# Patient Record
Sex: Male | Born: 1955 | Marital: Married | Smoking: Never smoker
Health system: Southern US, Community
[De-identification: ages and names within clinical notes are randomized; demographics above are authoritative.]

## PROBLEM LIST (undated history)

## (undated) DIAGNOSIS — J189 Pneumonia, unspecified organism: Secondary | ICD-10-CM

## (undated) DIAGNOSIS — R569 Unspecified convulsions: Secondary | ICD-10-CM

## (undated) DIAGNOSIS — F419 Anxiety disorder, unspecified: Secondary | ICD-10-CM

## (undated) DIAGNOSIS — R51 Headache: Secondary | ICD-10-CM

## (undated) DIAGNOSIS — T8859XA Other complications of anesthesia, initial encounter: Secondary | ICD-10-CM

## (undated) DIAGNOSIS — K219 Gastro-esophageal reflux disease without esophagitis: Secondary | ICD-10-CM

## (undated) DIAGNOSIS — R519 Headache, unspecified: Secondary | ICD-10-CM

## (undated) DIAGNOSIS — R7303 Prediabetes: Secondary | ICD-10-CM

## (undated) DIAGNOSIS — I10 Essential (primary) hypertension: Secondary | ICD-10-CM

## (undated) DIAGNOSIS — Z9981 Dependence on supplemental oxygen: Secondary | ICD-10-CM

## (undated) DIAGNOSIS — N189 Chronic kidney disease, unspecified: Secondary | ICD-10-CM

## (undated) DIAGNOSIS — R590 Localized enlarged lymph nodes: Secondary | ICD-10-CM

## (undated) DIAGNOSIS — T4145XA Adverse effect of unspecified anesthetic, initial encounter: Secondary | ICD-10-CM

## (undated) DIAGNOSIS — F32A Depression, unspecified: Secondary | ICD-10-CM

## (undated) DIAGNOSIS — M1990 Unspecified osteoarthritis, unspecified site: Secondary | ICD-10-CM

## (undated) DIAGNOSIS — F329 Major depressive disorder, single episode, unspecified: Secondary | ICD-10-CM

## (undated) HISTORY — PX: APPENDECTOMY: SHX54

## (undated) HISTORY — PX: CHOLECYSTECTOMY: SHX55

## (undated) HISTORY — PX: CARDIAC CATHETERIZATION: SHX172

---

## 2007-07-02 ENCOUNTER — Encounter
Admission: RE | Admit: 2007-07-02 | Discharge: 2007-09-30 | Payer: Self-pay | Admitting: Physical Medicine & Rehabilitation

## 2007-07-07 ENCOUNTER — Ambulatory Visit: Payer: Self-pay | Admitting: Physical Medicine & Rehabilitation

## 2007-09-01 ENCOUNTER — Ambulatory Visit: Payer: Self-pay | Admitting: Physical Medicine & Rehabilitation

## 2007-11-21 ENCOUNTER — Encounter
Admission: RE | Admit: 2007-11-21 | Discharge: 2007-11-21 | Payer: Self-pay | Admitting: Physical Medicine & Rehabilitation

## 2010-07-04 NOTE — Group Therapy Note (Signed)
REFERRING PHYSICIAN:  Dr. Roselle Locus.   PURPOSE OF EVALUATION:  Evaluating chronic low back pain.   HISTORY OF PRESENT ILLNESS:  Corey Griffith is a 55 year old gentleman  referred to this office by Dr. Roselle Locus, his primary care physician  for evaluation and treatment of chronic low back pain.  Minimal medical  records accompany the patient and those were reviewed prior to the  office visit and then with the patient in the office today.   It appears that the patient has had a history of lumbar and thoracic  compression fractures from T8 through T10 after a fall onto a pavement  in 1993. He also suffered a basilar skull fracture at that same time.  He reports persistent back pain since that time.   February 25, 2007, a lumbar spine film showed no acute injury with  degenerative changes especially over the facet joints inferiorly.   February 24, 2007, the patient saw Dr. Roselle Locus and was given Darvocet  for pain management. He previously had been treated by Dr. Craig Guess and  that treatment was discontinued secondary to Dr. Craig Guess not accepting  the patient's insurance. He apparently has been under worker's comp  through Dr. Craig Guess since 1993. He had started Hydrocodone appropriate  three weeks ago in March and that was prescribed by Dr. Craig Guess one  time.  Subsequently, he has received only Darvocet from Dr. Roselle Locus.   The patient reports pain in his mid back down to his waist with  complaints of stabbing, burning and constant aching. He reports that he  can walk approximately 100 yards before he needs to rest.  He reports  having been off and on the Darvocet in the past, but only having started  the Hydrocodone recently. He does report better relief with Hydrocodone  compared to the Darvocet.   PAST MEDICAL HISTORY:  1. History of thoracic and lumbar compression fractures T8-T10 after a      fall onto pavement in 1993.  2. Thoracic epidural steroid injections March 2001 and  June 2001.  3. History of basilar skull fracture after same fall in 1993.  4. Gastroesophageal reflux disease.  5. 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  6. Hypertension.  7. Dyslipidemia.  8. Impaired fasting glucose.  9. Chronic low back pain.  10.Obesity.  11.Depression.   ALLERGIES:  AMOXICILLIN CAUSES DIARRHEA. NEURONTIN CAUSES PERSONALITY  CHANGES.   FAMILY HISTORY:  Noncontributory.   SOCIAL HISTORY:  The patient is married with two children ages 41 and  26. He did graduate from high school himself. He does not use alcohol or  tobacco. He works in a Development worker, [employer]  using a machine.   MEDICATIONS:  1. Dilantin 100 mg daily.  2. Lortab 5/500 one tablet q.i.d. p.r.n.  3. Hyzaar 10/40 one tablet daily.  4. Simvastatin 80 mg daily.  5. Ambien 5 mg daily.  6. Darvocet one to two b.i.d. p.r.n.  7. Paxil 20 mg two tablets daily.  8. Protonix or Prevacid daily.   REVIEW OF SYSTEMS:  Positive for night sweats and reflux.   PHYSICAL EXAMINATION:  Reasonably well-appearing large adult male.  Height 6 foot 2 inches. Weight 323 pounds. Blood pressure is 151/94.  Pulse 75. Respiratory rate 18. O2 saturation 96% on room air.  Complains  of pain in his low back, but ambulates without any assistive device.  Upper extremity range of motion was slightly decreased in full shoulder  abduction, but  good in flexion.  Internal and external rotation were  within functional limits. Range of motion showed decreased in lateral  bending and extension with fairly good flexion.  Upper extremity exam  showed 5/5 strength throughout.  Bulk and tone were normal. Reflexes 2+  and symmetrical. Sensation was intact to light touch of the bilateral  upper extremities.  Lower extremity exam showed 5/5 strength in hip  flexion and extension and ankle dorsiflexion was normal bulk and tone.  The patient had normal hip range of motion in the supine  position. He  denied any bowel or bladder incontinence.   IMPRESSION:  1. Degenerative disk disease/spondylosis with history of prior trauma      in 1993.  2. Compression fractures T8-T10 suffered after a fall in 1993, as a      worker's comp injury.   In the office today, we did decide to discontinue the Darvocet and  instead use the Hydrocodone 5/500 one tablet t.i.d. p.r.n.  He  understands that all pain medicines need to come through this office. He  has a sufficient supply until early June and I have given him a script  for July 28, 2007. We did obtain a urine drug screen from the patient in  the office today.  Interesting is that he needs to comply with our rules  to continue treatment through this office.   Will plan on seeing the patient in follow up in approximately two  months' time with refills prior to that appointment as necessary.           ______________________________  Ellwood Dense, M.D.     DC/MedQ  D:  07/09/2007 09:01:03  T:  07/09/2007 10:49:49  Job #:  [id number]

## 2010-07-04 NOTE — Assessment & Plan Note (Signed)
Corey Griffith returns to clinic today for followup evaluation.  I first  and last saw the patient in this office on Jul 07, 2007, on referral  from Dr. Roselle Locus for evaluation and treatment of chronic low back  pain.  At that time, we decided to switch him from Darvocet to  hydrocodone 5/500 and he was allowed to use that tablet 3 times a day as  needed.  He has been using the medication, but generally, 0 to 3 tablets  per day.  He prefers to avoid narcotic use if possible, but he does use  the medication when absolutely necessary.  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  He  reports that he has developed some arthritic pain in his right elbow and  plans to see his primary care physician regarding that.  He has had a  recent refill on the hydrocodone as of August 22, 2007.  He is off his  Dilantin as he has apparently lost the bottle, but he plans to contact  his neurologist to restart that medication.   MEDICATIONS:  1. Dilantin 100 mg daily.  2. Lortab 5/500 one tablet t.i.d. p.r.n.  3. Hyzaar 10/40 one tablet daily.  4. Simvastatin 80 mg daily.  5. Ambien 5 mg nightly p.r.n.  6. Paxil 20 mg 2 tablets daily.  7. Protonix or Prevacid daily.   REVIEW OF SYSTEMS:  Positive for limb swelling.   PHYSICAL EXAMINATION:  GENERAL:  Well-appearing, large, overweight,  adult male in mild-to-no acute discomfort.  VITAL SIGNS:  Not obtained in the office today.  He has 5/5 strength  throughout the bilateral upper and lower extremities.  Lumbar range of  motion was decreased in all planes.   IMPRESSION:  1. Degenerative disk disease/spondylosis with history of prior trauma      in 1993.  2. Compression fractures at T8-T10 suffered after a fall in 1993 as a      workers' comp injury.   In the office today, no refill on medication is necessary.  We will plan  on seeing the patient in follow up in this office at approximately 3  months'  time.  He continues to be very compliant with medication without  signs of diversion or significant side effects.  He is generally using 0  to 3 tablets of hydrocodone per day, but prefers to avoid it if at all  possible.  We will plan on seeing the patient in follow up in  approximately 3 months' time with refills prior to that appointment if  necessary.           ______________________________  Ellwood Dense, M.D.     DC/MedQ  D:  09/01/2007 11:39:12  T:  09/02/2007 07:45:54  Job #:  [id number]

## 2011-02-20 HISTORY — PX: BACK SURGERY: SHX140

## 2012-10-16 ENCOUNTER — Other Ambulatory Visit: Payer: Self-pay | Admitting: Neurosurgery

## 2012-10-27 ENCOUNTER — Encounter (HOSPITAL_COMMUNITY): Payer: Self-pay | Admitting: Respiratory Therapy

## 2012-10-29 ENCOUNTER — Encounter (HOSPITAL_COMMUNITY)
Admission: RE | Admit: 2012-10-29 | Discharge: 2012-10-29 | Disposition: A | Discharge status: Home / Self Care | Payer: 59 | Source: Ambulatory Visit | Attending: Neurosurgery | Admitting: Neurosurgery

## 2012-10-29 ENCOUNTER — Encounter (HOSPITAL_COMMUNITY): Payer: Self-pay | Admitting: Pharmacy Technician

## 2012-10-29 ENCOUNTER — Encounter (HOSPITAL_COMMUNITY): Payer: Self-pay

## 2012-10-29 ENCOUNTER — Inpatient Hospital Stay (HOSPITAL_COMMUNITY): Admission: RE | Admit: 2012-10-29 | Payer: Self-pay | Source: Ambulatory Visit

## 2012-10-29 DIAGNOSIS — Z01818 Encounter for other preprocedural examination: Secondary | ICD-10-CM | POA: Insufficient documentation

## 2012-10-29 DIAGNOSIS — Z01812 Encounter for preprocedural laboratory examination: Secondary | ICD-10-CM | POA: Insufficient documentation

## 2012-10-29 HISTORY — DX: Anxiety disorder, unspecified: F41.9

## 2012-10-29 HISTORY — DX: Unspecified convulsions: R56.9

## 2012-10-29 HISTORY — DX: Chronic kidney disease, unspecified: N18.9

## 2012-10-29 HISTORY — DX: Unspecified osteoarthritis, unspecified site: M19.90

## 2012-10-29 HISTORY — DX: Essential (primary) hypertension: I10

## 2012-10-29 HISTORY — DX: Gastro-esophageal reflux disease without esophagitis: K21.9

## 2012-10-29 LAB — BASIC METABOLIC PANEL
BUN: 10 mg/dL (ref 6–23)
Calcium: 9.4 mg/dL (ref 8.4–10.5)
Creatinine, Ser: 1.1 mg/dL (ref 0.50–1.35)
GFR calc Af Amer: 86 mL/min — ABNORMAL LOW (ref >90)
GFR calc non Af Amer: 74 mL/min — ABNORMAL LOW (ref >90)
Glucose, Bld: 95 mg/dL (ref 70–99)

## 2012-10-29 LAB — CBC WITH DIFFERENTIAL/PLATELET
Eosinophils Relative: 4 % (ref 0–5)
HCT: 41.7 % (ref 39.0–52.0)
Hemoglobin: 14.4 g/dL (ref 13.0–17.0)
Lymphocytes Relative: 29 % (ref 12–46)
Lymphs Abs: 2.3 10*3/uL (ref 0.7–4.0)
MCV: 91.2 fL (ref 78.0–100.0)
Monocytes Relative: 9 % (ref 3–12)
Platelets: 231 10*3/uL (ref 150–400)
RBC: 4.57 MIL/uL (ref 4.22–5.81)
WBC: 8.2 10*3/uL (ref 4.0–10.5)

## 2012-10-29 LAB — ABO/RH: ABO/RH(D): O POS

## 2012-10-29 LAB — TYPE AND SCREEN: ABO/RH(D): O POS

## 2012-10-29 NOTE — Pre-Procedure Instructions (Signed)
Corey Griffith  10/29/2012   Your procedure is scheduled on:  October 31, 2012 at 7:30 AM  Report to [facility] at 5:30 AM.  Call this number if you have problems the morning of surgery: [phone]   Remember:   Do not eat food or drink liquids after midnight.   Take these medicines the morning of surgery with A SIP OF WATER: Azor, Clonazepam, Cymbalta, Dilantin, Oxycodone   Do not wear jewelry, make-up or nail polish.  Do not wear lotions, powders, or perfumes. You may wear deodorant.  Do not shave 48 hours prior to surgery. Men may shave face and neck.  Do not bring valuables to the hospital.  [facility] is not responsible                   for any belongings or valuables.  Contacts, dentures or bridgework may not be worn into surgery.  Leave suitcase in the car. After surgery it may be brought to your room.  For patients admitted to the hospital, checkout time is 11:00 AM the day of  discharge.     Special Instructions: Shower using CHG 2 nights before surgery and the night before surgery.  If you shower the day of surgery use CHG.  Use special wash - you have one bottle of CHG for all showers.  You should use approximately 1/3 of the bottle for each shower.   Please read over the following fact sheets that you were given: Pain Booklet, Coughing and Deep Breathing, Blood Transfusion Information, MRSA Information and Surgical Site Infection Prevention

## 2012-10-30 MED ORDER — CEFAZOLIN SODIUM-DEXTROSE 2-3 GM-% IV SOLR
2.0000 g | INTRAVENOUS | Status: AC
  Administered 2012-10-31: 2 g via INTRAVENOUS
  Filled 2012-10-30: qty 50

## 2012-10-31 ENCOUNTER — Ambulatory Visit (HOSPITAL_COMMUNITY): Payer: 59

## 2012-10-31 ENCOUNTER — Inpatient Hospital Stay (HOSPITAL_COMMUNITY)
Admission: RE | Admit: 2012-10-31 | Discharge: 2012-11-04 | DRG: 460 | Disposition: A | Discharge status: Home / Self Care | Payer: 59 | Source: Ambulatory Visit | Attending: Neurosurgery | Admitting: Neurosurgery

## 2012-10-31 ENCOUNTER — Ambulatory Visit (HOSPITAL_COMMUNITY): Payer: 59 | Admitting: Anesthesiology

## 2012-10-31 ENCOUNTER — Encounter (HOSPITAL_COMMUNITY)
Admission: RE | Disposition: A | Discharge status: Home / Self Care | Payer: Self-pay | Source: Ambulatory Visit | Attending: Neurosurgery

## 2012-10-31 ENCOUNTER — Encounter (HOSPITAL_COMMUNITY): Payer: Self-pay | Admitting: Anesthesiology

## 2012-10-31 ENCOUNTER — Encounter (HOSPITAL_COMMUNITY): Payer: Self-pay | Admitting: *Deleted

## 2012-10-31 DIAGNOSIS — N189 Chronic kidney disease, unspecified: Secondary | ICD-10-CM | POA: Diagnosis present

## 2012-10-31 DIAGNOSIS — M48062 Spinal stenosis, lumbar region with neurogenic claudication: Principal | ICD-10-CM | POA: Diagnosis present

## 2012-10-31 DIAGNOSIS — M19079 Primary osteoarthritis, unspecified ankle and foot: Secondary | ICD-10-CM | POA: Diagnosis present

## 2012-10-31 DIAGNOSIS — K219 Gastro-esophageal reflux disease without esophagitis: Secondary | ICD-10-CM | POA: Diagnosis present

## 2012-10-31 DIAGNOSIS — I129 Hypertensive chronic kidney disease with stage 1 through stage 4 chronic kidney disease, or unspecified chronic kidney disease: Secondary | ICD-10-CM | POA: Diagnosis present

## 2012-10-31 SURGERY — POSTERIOR LUMBAR FUSION 1 LEVEL
Anesthesia: General | Site: Back | Wound class: Clean

## 2012-10-31 MED ORDER — PHENYLEPHRINE HCL 10 MG/ML IJ SOLN
INTRAMUSCULAR | Status: DC | PRN
  Administered 2012-10-31: 120 ug via INTRAVENOUS
  Administered 2012-10-31 (×2): 80 ug via INTRAVENOUS
  Administered 2012-10-31: 120 ug via INTRAVENOUS

## 2012-10-31 MED ORDER — POTASSIUM CHLORIDE CRYS ER 20 MEQ PO TBCR
20.0000 meq | EXTENDED_RELEASE_TABLET | Freq: Every day | ORAL | Status: DC
  Administered 2012-10-31 – 2012-11-04 (×5): 20 meq via ORAL
  Filled 2012-10-31 (×5): qty 1

## 2012-10-31 MED ORDER — SODIUM CHLORIDE 0.9 % IJ SOLN
3.0000 mL | Freq: Two times a day (BID) | INTRAMUSCULAR | Status: DC
  Administered 2012-11-01 – 2012-11-03 (×6): 3 mL via INTRAVENOUS

## 2012-10-31 MED ORDER — SODIUM CHLORIDE 0.9 % IJ SOLN: 3.0000 mL | INTRAMUSCULAR | Status: DC | PRN

## 2012-10-31 MED ORDER — OXYCODONE HCL 5 MG PO TABS
5.0000 mg | ORAL_TABLET | Freq: Once | ORAL | Status: AC | PRN
  Administered 2012-10-31: 5 mg via ORAL

## 2012-10-31 MED ORDER — THROMBIN 5000 UNITS EX SOLR
CUTANEOUS | Status: DC | PRN
  Administered 2012-10-31: 5000 [IU] via TOPICAL

## 2012-10-31 MED ORDER — PANTOPRAZOLE SODIUM 40 MG PO TBEC
40.0000 mg | DELAYED_RELEASE_TABLET | Freq: Every day | ORAL | Status: DC
  Administered 2012-10-31 – 2012-11-04 (×5): 40 mg via ORAL
  Filled 2012-10-31 (×4): qty 1

## 2012-10-31 MED ORDER — ACETAMINOPHEN 325 MG PO TABS: 650.0000 mg | ORAL_TABLET | ORAL | Status: DC | PRN

## 2012-10-31 MED ORDER — OXYCODONE-ACETAMINOPHEN 5-325 MG PO TABS
1.0000 | ORAL_TABLET | ORAL | Status: DC | PRN
  Administered 2012-10-31 – 2012-11-04 (×15): 2 via ORAL
  Filled 2012-10-31 (×15): qty 2

## 2012-10-31 MED ORDER — PHENYTOIN SODIUM EXTENDED 100 MG PO CAPS
400.0000 mg | ORAL_CAPSULE | Freq: Every morning | ORAL | Status: DC
  Administered 2012-11-01 – 2012-11-04 (×4): 400 mg via ORAL
  Filled 2012-10-31 (×4): qty 4

## 2012-10-31 MED ORDER — VECURONIUM BROMIDE 10 MG IV SOLR
INTRAVENOUS | Status: DC | PRN
  Administered 2012-10-31: 2 mg via INTRAVENOUS
  Administered 2012-10-31: 4 mg via INTRAVENOUS
  Administered 2012-10-31: 1 mg via INTRAVENOUS

## 2012-10-31 MED ORDER — NEOSTIGMINE METHYLSULFATE 1 MG/ML IJ SOLN
INTRAMUSCULAR | Status: DC | PRN
  Administered 2012-10-31: 3 mg via INTRAVENOUS

## 2012-10-31 MED ORDER — LIDOCAINE HCL (CARDIAC) 20 MG/ML IV SOLN
INTRAVENOUS | Status: DC | PRN
  Administered 2012-10-31: 60 mg via INTRAVENOUS

## 2012-10-31 MED ORDER — GLYCOPYRROLATE 0.2 MG/ML IJ SOLN
INTRAMUSCULAR | Status: DC | PRN
  Administered 2012-10-31: 0.4 mg via INTRAVENOUS

## 2012-10-31 MED ORDER — FENTANYL CITRATE 0.05 MG/ML IJ SOLN
INTRAMUSCULAR | Status: DC | PRN
  Administered 2012-10-31: 150 ug via INTRAVENOUS
  Administered 2012-10-31: 100 ug via INTRAVENOUS

## 2012-10-31 MED ORDER — SENNA 8.6 MG PO TABS
1.0000 | ORAL_TABLET | Freq: Two times a day (BID) | ORAL | Status: DC
  Administered 2012-10-31 – 2012-11-04 (×9): 8.6 mg via ORAL
  Filled 2012-10-31 (×10): qty 1

## 2012-10-31 MED ORDER — PHENYTOIN SODIUM EXTENDED 100 MG PO CAPS: 300.0000 mg | ORAL_CAPSULE | Freq: Two times a day (BID) | ORAL | Status: DC

## 2012-10-31 MED ORDER — SODIUM CHLORIDE 0.9 % IV SOLN: 250.0000 mL | INTRAVENOUS | Status: DC

## 2012-10-31 MED ORDER — THROMBIN 20000 UNITS EX SOLR
CUTANEOUS | Status: DC | PRN
  Administered 2012-10-31: 07:00:00 via TOPICAL

## 2012-10-31 MED ORDER — HYDROMORPHONE HCL PF 1 MG/ML IJ SOLN
0.5000 mg | INTRAMUSCULAR | Status: DC | PRN
  Administered 2012-10-31: 1 mg via INTRAVENOUS
  Filled 2012-10-31: qty 1

## 2012-10-31 MED ORDER — HYDROMORPHONE HCL PF 1 MG/ML IJ SOLN
INTRAMUSCULAR | Status: AC
  Filled 2012-10-31: qty 1

## 2012-10-31 MED ORDER — VANCOMYCIN HCL IN DEXTROSE 1-5 GM/200ML-% IV SOLN
1000.0000 mg | Freq: Once | INTRAVENOUS | Status: AC
  Administered 2012-10-31: 1000 mg via INTRAVENOUS
  Filled 2012-10-31: qty 200

## 2012-10-31 MED ORDER — ALUM & MAG HYDROXIDE-SIMETH 200-200-20 MG/5ML PO SUSP: 30.0000 mL | Freq: Four times a day (QID) | ORAL | Status: DC | PRN

## 2012-10-31 MED ORDER — ONDANSETRON HCL 4 MG/2ML IJ SOLN: 4.0000 mg | INTRAMUSCULAR | Status: DC | PRN

## 2012-10-31 MED ORDER — ACETAMINOPHEN 650 MG RE SUPP: 650.0000 mg | RECTAL | Status: DC | PRN

## 2012-10-31 MED ORDER — FUROSEMIDE 40 MG PO TABS
40.0000 mg | ORAL_TABLET | Freq: Every day | ORAL | Status: DC | PRN
  Filled 2012-10-31: qty 1

## 2012-10-31 MED ORDER — HYDROCODONE-ACETAMINOPHEN 5-325 MG PO TABS
1.0000 | ORAL_TABLET | ORAL | Status: DC | PRN
  Administered 2012-10-31: 2 via ORAL
  Administered 2012-11-01: 1 via ORAL
  Filled 2012-10-31 (×2): qty 2

## 2012-10-31 MED ORDER — SODIUM CHLORIDE 0.9 % IR SOLN
Status: DC | PRN
  Administered 2012-10-31: 07:00:00

## 2012-10-31 MED ORDER — HYDROMORPHONE HCL PF 1 MG/ML IJ SOLN
0.2500 mg | INTRAMUSCULAR | Status: DC | PRN
  Administered 2012-10-31 (×2): 0.5 mg via INTRAVENOUS

## 2012-10-31 MED ORDER — AMLODIPINE-OLMESARTAN 10-40 MG PO TABS: 1.0000 | ORAL_TABLET | Freq: Every day | ORAL | Status: DC

## 2012-10-31 MED ORDER — AMLODIPINE BESYLATE 10 MG PO TABS
10.0000 mg | ORAL_TABLET | Freq: Every day | ORAL | Status: DC
  Administered 2012-11-01 – 2012-11-04 (×4): 10 mg via ORAL
  Filled 2012-10-31 (×4): qty 1

## 2012-10-31 MED ORDER — OXYCODONE HCL ER 15 MG PO T12A
30.0000 mg | EXTENDED_RELEASE_TABLET | Freq: Two times a day (BID) | ORAL | Status: DC
  Administered 2012-10-31 – 2012-11-04 (×8): 30 mg via ORAL
  Filled 2012-10-31 (×8): qty 2

## 2012-10-31 MED ORDER — OXYCODONE HCL ER 30 MG PO T12A: 30.0000 mg | EXTENDED_RELEASE_TABLET | Freq: Two times a day (BID) | ORAL | Status: DC

## 2012-10-31 MED ORDER — ONDANSETRON HCL 4 MG/2ML IJ SOLN
INTRAMUSCULAR | Status: DC | PRN
  Administered 2012-10-31: 4 mg via INTRAVENOUS

## 2012-10-31 MED ORDER — MENTHOL 3 MG MT LOZG: 1.0000 | LOZENGE | OROMUCOSAL | Status: DC | PRN

## 2012-10-31 MED ORDER — ALBUMIN HUMAN 5 % IV SOLN
INTRAVENOUS | Status: DC | PRN
  Administered 2012-10-31: 10:00:00 via INTRAVENOUS

## 2012-10-31 MED ORDER — CLONAZEPAM 1 MG PO TABS: 1.0000 mg | ORAL_TABLET | Freq: Two times a day (BID) | ORAL | Status: DC | PRN

## 2012-10-31 MED ORDER — OXYCODONE HCL 5 MG PO TABS
ORAL_TABLET | ORAL | Status: AC
  Filled 2012-10-31: qty 1

## 2012-10-31 MED ORDER — OXYCODONE HCL 5 MG/5ML PO SOLN: 5.0000 mg | Freq: Once | ORAL | Status: AC | PRN

## 2012-10-31 MED ORDER — LACTATED RINGERS IV SOLN
INTRAVENOUS | Status: DC | PRN
  Administered 2012-10-31 (×3): via INTRAVENOUS

## 2012-10-31 MED ORDER — PROPOFOL 10 MG/ML IV BOLUS
INTRAVENOUS | Status: DC | PRN
  Administered 2012-10-31: 200 mg via INTRAVENOUS

## 2012-10-31 MED ORDER — FLEET ENEMA 7-19 GM/118ML RE ENEM: 1.0000 | ENEMA | Freq: Once | RECTAL | Status: AC | PRN

## 2012-10-31 MED ORDER — IRBESARTAN 300 MG PO TABS
300.0000 mg | ORAL_TABLET | Freq: Every day | ORAL | Status: DC
  Administered 2012-11-01 – 2012-11-04 (×4): 300 mg via ORAL
  Filled 2012-10-31 (×4): qty 1

## 2012-10-31 MED ORDER — PHENOL 1.4 % MT LIQD
1.0000 | OROMUCOSAL | Status: DC | PRN
  Administered 2012-10-31: 1 via OROMUCOSAL
  Filled 2012-10-31: qty 177

## 2012-10-31 MED ORDER — ROCURONIUM BROMIDE 100 MG/10ML IV SOLN
INTRAVENOUS | Status: DC | PRN
  Administered 2012-10-31: 50 mg via INTRAVENOUS

## 2012-10-31 MED ORDER — EPHEDRINE SULFATE 50 MG/ML IJ SOLN
INTRAMUSCULAR | Status: DC | PRN
  Administered 2012-10-31: 10 mg via INTRAVENOUS

## 2012-10-31 MED ORDER — DEXAMETHASONE SODIUM PHOSPHATE 10 MG/ML IJ SOLN
10.0000 mg | INTRAMUSCULAR | Status: AC
  Administered 2012-10-31: 10 mg via INTRAVENOUS
  Filled 2012-10-31: qty 1

## 2012-10-31 MED ORDER — HEMOSTATIC AGENTS (NO CHARGE) OPTIME
TOPICAL | Status: DC | PRN
  Administered 2012-10-31: 1 via TOPICAL

## 2012-10-31 MED ORDER — POLYETHYLENE GLYCOL 3350 17 G PO PACK
17.0000 g | PACK | Freq: Every day | ORAL | Status: DC | PRN
  Administered 2012-11-02 – 2012-11-03 (×2): 17 g via ORAL
  Filled 2012-10-31 (×2): qty 1

## 2012-10-31 MED ORDER — PHENYTOIN SODIUM EXTENDED 100 MG PO CAPS
300.0000 mg | ORAL_CAPSULE | Freq: Every day | ORAL | Status: DC
  Administered 2012-10-31 – 2012-11-03 (×4): 300 mg via ORAL
  Filled 2012-10-31 (×5): qty 3

## 2012-10-31 MED ORDER — BISACODYL 10 MG RE SUPP: 10.0000 mg | Freq: Every day | RECTAL | Status: DC | PRN

## 2012-10-31 MED ORDER — DIAZEPAM 5 MG PO TABS
5.0000 mg | ORAL_TABLET | Freq: Four times a day (QID) | ORAL | Status: DC | PRN
  Administered 2012-11-01: 10 mg via ORAL
  Administered 2012-11-02 (×2): 5 mg via ORAL
  Filled 2012-10-31: qty 1
  Filled 2012-10-31 (×2): qty 2

## 2012-10-31 MED ORDER — MIDAZOLAM HCL 5 MG/5ML IJ SOLN
INTRAMUSCULAR | Status: DC | PRN
  Administered 2012-10-31: 2 mg via INTRAVENOUS

## 2012-10-31 MED ORDER — DULOXETINE HCL 60 MG PO CPEP
60.0000 mg | ORAL_CAPSULE | Freq: Two times a day (BID) | ORAL | Status: DC
  Administered 2012-10-31 – 2012-11-04 (×8): 60 mg via ORAL
  Filled 2012-10-31 (×9): qty 1

## 2012-10-31 MED ORDER — 0.9 % SODIUM CHLORIDE (POUR BTL) OPTIME
TOPICAL | Status: DC | PRN
  Administered 2012-10-31: 1000 mL

## 2012-10-31 SURGICAL SUPPLY — 59 items
BAG DECANTER FOR FLEXI CONT (MISCELLANEOUS) ×2 IMPLANT
BENZOIN TINCTURE PRP APPL 2/3 (GAUZE/BANDAGES/DRESSINGS) ×2 IMPLANT
BLADE SURG ROTATE 9660 (MISCELLANEOUS) IMPLANT
BRUSH SCRUB EZ PLAIN DRY (MISCELLANEOUS) ×2 IMPLANT
BUR CUTTER 7.0 ROUND (BURR) ×2 IMPLANT
BUR MATCHSTICK NEURO 3.0 HP (BURR) ×2 IMPLANT
CAGE PEEK 14X22 SPINAL (Cage) ×4 IMPLANT
CANISTER SUCTION 2500CC (MISCELLANEOUS) ×2 IMPLANT
CAP LCK SPNE (Orthopedic Implant) ×4 IMPLANT
CAP LOCK SPINE RADIUS (Orthopedic Implant) ×4 IMPLANT
CAP LOCKING (Orthopedic Implant) ×4 IMPLANT
CLOTH BEACON ORANGE TIMEOUT ST (SAFETY) ×2 IMPLANT
CONT SPEC 4OZ CLIKSEAL STRL BL (MISCELLANEOUS) ×4 IMPLANT
DECANTER SPIKE VIAL GLASS SM (MISCELLANEOUS) ×2 IMPLANT
DERMABOND ADVANCED (GAUZE/BANDAGES/DRESSINGS) ×1
DERMABOND ADVANCED .7 DNX12 (GAUZE/BANDAGES/DRESSINGS) ×1 IMPLANT
DRAPE C-ARM 42X72 X-RAY (DRAPES) ×4 IMPLANT
DRAPE LAPAROTOMY 100X72X124 (DRAPES) ×2 IMPLANT
DRAPE MICROSCOPE ZEISS OPMI (DRAPES) ×2 IMPLANT
DRAPE POUCH INSTRU U-SHP 10X18 (DRAPES) ×2 IMPLANT
DRAPE PROXIMA HALF (DRAPES) IMPLANT
DRAPE SURG 17X23 STRL (DRAPES) ×4 IMPLANT
DRSG OPSITE POSTOP 4X8 (GAUZE/BANDAGES/DRESSINGS) ×2 IMPLANT
DURAPREP 26ML APPLICATOR (WOUND CARE) ×2 IMPLANT
ELECT REM PT RETURN 9FT ADLT (ELECTROSURGICAL) ×2
ELECTRODE REM PT RTRN 9FT ADLT (ELECTROSURGICAL) ×1 IMPLANT
GAUZE SPONGE 4X4 16PLY XRAY LF (GAUZE/BANDAGES/DRESSINGS) IMPLANT
GLOVE BIO SURGEON STRL SZ8 (GLOVE) ×2 IMPLANT
GLOVE ECLIPSE 8.5 STRL (GLOVE) ×4 IMPLANT
GLOVE EXAM NITRILE LRG STRL (GLOVE) ×4 IMPLANT
GLOVE EXAM NITRILE MD LF STRL (GLOVE) IMPLANT
GLOVE EXAM NITRILE XL STR (GLOVE) IMPLANT
GLOVE EXAM NITRILE XS STR PU (GLOVE) IMPLANT
GLOVE INDICATOR 8.5 STRL (GLOVE) ×2 IMPLANT
GOWN BRE IMP SLV AUR LG STRL (GOWN DISPOSABLE) ×4 IMPLANT
GOWN BRE IMP SLV AUR XL STRL (GOWN DISPOSABLE) ×6 IMPLANT
GOWN STRL REIN 2XL LVL4 (GOWN DISPOSABLE) IMPLANT
KIT BASIN OR (CUSTOM PROCEDURE TRAY) ×2 IMPLANT
KIT ROOM TURNOVER OR (KITS) ×2 IMPLANT
MILL MEDIUM DISP (BLADE) ×2 IMPLANT
NEEDLE HYPO 22GX1.5 SAFETY (NEEDLE) ×2 IMPLANT
NEEDLE SPNL 22GX3.5 QUINCKE BK (NEEDLE) ×2 IMPLANT
NS IRRIG 1000ML POUR BTL (IV SOLUTION) ×2 IMPLANT
PACK LAMINECTOMY NEURO (CUSTOM PROCEDURE TRAY) ×2 IMPLANT
PAD ARMBOARD 7.5X6 YLW CONV (MISCELLANEOUS) ×8 IMPLANT
ROD RADIUS 40MM (Neuro Prosthesis/Implant) ×2 IMPLANT
ROD SPNL 40X5.5XNS TI RDS (Neuro Prosthesis/Implant) ×2 IMPLANT
RUBBERBAND STERILE (MISCELLANEOUS) ×4 IMPLANT
SCREW 6.75X40MM (Screw) ×4 IMPLANT
SCREW 6.75X45MM (Screw) ×4 IMPLANT
SPONGE GAUZE 4X4 12PLY (GAUZE/BANDAGES/DRESSINGS) ×2 IMPLANT
SPONGE SURGIFOAM ABS GEL SZ50 (HEMOSTASIS) ×2 IMPLANT
STRIP CLOSURE SKIN 1/2X4 (GAUZE/BANDAGES/DRESSINGS) ×2 IMPLANT
SUT VIC AB 2-0 CT1 18 (SUTURE) ×4 IMPLANT
SUT VIC AB 3-0 SH 8-18 (SUTURE) ×6 IMPLANT
SYR 20ML ECCENTRIC (SYRINGE) ×2 IMPLANT
TOWEL OR 17X24 6PK STRL BLUE (TOWEL DISPOSABLE) ×2 IMPLANT
TOWEL OR 17X26 10 PK STRL BLUE (TOWEL DISPOSABLE) ×2 IMPLANT
WATER STERILE IRR 1000ML POUR (IV SOLUTION) ×2 IMPLANT

## 2012-10-31 NOTE — Progress Notes (Signed)
Pt 57 year old white male from PACU  S/p L4-L5 p-LIF,alert and oriented x 4  Moves all extremities and denied any numbness or  tingling  dsg to back incision intac hemo vac   In place intact and to suction draining bloody drainage pt made comfortable in bed  Oriented to the use of call bell . CB within pt reach to call as needed.  Spouse at bedside no acute distress pain under control . Marland Kitchen Rn will continue to monitor.

## 2012-10-31 NOTE — H&P (Signed)
Corey Griffith is an 57 y.o. male.   Chief Complaint: Back and bilateral leg pain HPI: 57 year old male status post previous L4-5 decompressive laminectomy done by another surgeon approximately one year ago. Patient with symptoms of recurrent back pain and severe neurogenic claudication failing conservative management. Workup demonstrates evidence of restenosis at L4-5 with potential instability at L4-5. Patient has been calcinosis to his options. He presents now for lumbar decompressive surgery.  Past Medical History  Diagnosis Date  . Hypertension   . Anxiety   . GERD (gastroesophageal reflux disease)   . Seizures   . Arthritis     back, ankles  . Chronic kidney disease     high ,low, no meds    Past Surgical History  Procedure Laterality Date  . Back surgery  2013  . Cardiac catheterization      History reviewed. No pertinent family history. Social History:  reports that he has never smoked. He has never used smokeless tobacco. He reports that he does not drink alcohol or use illicit drugs.  Allergies:  Allergies  Allergen Reactions  . Augmentin [Amoxicillin-Pot Clavulanate] Other (See Comments)    GI problems  . Gabapentin Other (See Comments)    Makes him go wild, not himself    Medications Prior to Admission  Medication Sig Dispense Refill  . amLODipine-olmesartan (AZOR) 10-40 MG per tablet Take 1 tablet by mouth daily.      . clonazePAM (KLONOPIN) 1 MG tablet Take 1 mg by mouth 2 (two) times daily as needed for anxiety.      . cyclobenzaprine (FLEXERIL) 10 MG tablet Take 10 mg by mouth 2 (two) times daily.       Marland Kitchen dexlansoprazole (DEXILANT) 60 MG capsule Take 60 mg by mouth daily.      . DULoxetine (CYMBALTA) 60 MG capsule Take 60 mg by mouth 2 (two) times daily.      . eszopiclone (LUNESTA) 2 MG TABS tablet Take 2 mg by mouth at bedtime. Take immediately before bedtime      . furosemide (LASIX) 40 MG tablet Take 40 mg by mouth daily as needed for fluid.      Marland Kitchen  lidocaine (LIDODERM) 5 % Place 1 patch onto the skin daily. Remove & Discard patch within 12 hours or as directed by MD      . OxyCODONE HCl ER (OXYCONTIN) 30 MG T12A Take 30 mg by mouth every 12 (twelve) hours.      . phenytoin (DILANTIN) 100 MG ER capsule Take 300-400 mg by mouth 2 (two) times daily. Take 4 capsules in the morning and 3 capsules in the evening      . potassium chloride (K-DUR,KLOR-CON) 10 MEQ tablet Take 20 mEq by mouth daily.       . Vitamin D, Ergocalciferol, (DRISDOL) 50000 UNITS CAPS capsule Take 50,000 Units by mouth as needed.         Results for orders placed during the hospital encounter of 10/29/12 (from the past 48 hour(s))  SURGICAL PCR SCREEN     Status: None   Collection Time    10/29/12  3:12 PM      Result Value Range   MRSA, PCR NEGATIVE  NEGATIVE   Staphylococcus aureus NEGATIVE  NEGATIVE   Comment:            The Xpert SA Assay (FDA     approved for NASAL specimens     in patients over 75 years of age),     is  one component of     a comprehensive surveillance     program.  Test performance has     been validated by [facility] for patients greater     than or equal to 80 year old.     It is not intended     to diagnose infection nor to     guide or monitor treatment.  BASIC METABOLIC PANEL     Status: Abnormal   Collection Time    10/29/12  3:14 PM      Result Value Range   Sodium 139  135 - 145 mEq/L   Potassium 4.7  3.5 - 5.1 mEq/L   Chloride 104  96 - 112 mEq/L   CO2 28  19 - 32 mEq/L   Glucose, Bld 95  70 - 99 mg/dL   BUN 10  6 - 23 mg/dL   Creatinine, Ser 6.96  0.50 - 1.35 mg/dL   Calcium 9.4  8.4 - 29.5 mg/dL   GFR calc non Af Amer 74 (*) >90 mL/min   GFR calc Af Amer 86 (*) >90 mL/min   Comment: (NOTE)     The eGFR has been calculated using the CKD EPI equation.     This calculation has not been validated in all clinical situations.     eGFR's persistently <90 mL/min signify possible Chronic Kidney     Disease.  CBC WITH  DIFFERENTIAL     Status: None   Collection Time    10/29/12  3:14 PM      Result Value Range   WBC 8.2  4.0 - 10.5 K/uL   RBC 4.57  4.22 - 5.81 MIL/uL   Hemoglobin 14.4  13.0 - 17.0 g/dL   HCT 28.4  13.2 - 44.0 %   MCV 91.2  78.0 - 100.0 fL   MCH 31.5  26.0 - 34.0 pg   MCHC 34.5  30.0 - 36.0 g/dL   RDW 10.2  72.5 - 36.6 %   Platelets 231  150 - 400 K/uL   Neutrophils Relative % 58  43 - 77 %   Neutro Abs 4.7  1.7 - 7.7 K/uL   Lymphocytes Relative 29  12 - 46 %   Lymphs Abs 2.3  0.7 - 4.0 K/uL   Monocytes Relative 9  3 - 12 %   Monocytes Absolute 0.7  0.1 - 1.0 K/uL   Eosinophils Relative 4  0 - 5 %   Eosinophils Absolute 0.3  0.0 - 0.7 K/uL   Basophils Relative 1  0 - 1 %   Basophils Absolute 0.0  0.0 - 0.1 K/uL  TYPE AND SCREEN     Status: None   Collection Time    10/29/12  3:16 PM      Result Value Range   ABO/RH(D) O POS     Antibody Screen NEG     Sample Expiration 11/01/2012    ABO/RH     Status: None   Collection Time    10/29/12  3:16 PM      Result Value Range   ABO/RH(D) O POS     Dg Chest 2 View  10/31/2012   CLINICAL DATA:  Preop for lumbar fusion. History of hypertension.  EXAM: CHEST  2 VIEW  COMPARISON:  None.  FINDINGS: Shallow inspiration. The heart size and mediastinal contours are within normal limits. Both lungs are clear. The visualized skeletal structures are unremarkable. Degenerative changes in the spine.  IMPRESSION: No active cardiopulmonary disease.   Electronically Signed   By: Burman Nieves   On: 10/31/2012 06:47    Review of Systems  Constitutional: Negative.   HENT: Negative.   Eyes: Negative.   Respiratory: Negative.   Cardiovascular: Negative.   Gastrointestinal: Negative.   Genitourinary: Negative.   Musculoskeletal: Negative.   Skin: Negative.   Neurological: Negative.   Endo/Heme/Allergies: Negative.   Psychiatric/Behavioral: Negative.     Blood pressure 108/78, pulse 90, resp. rate 18, SpO2 95.00%. Physical Exam   Constitutional: He is oriented to person, place, and time. He appears well-developed and well-nourished. No distress.  HENT:  Head: Normocephalic and atraumatic.  Right Ear: External ear normal.  Left Ear: External ear normal.  Nose: Nose normal.  Mouth/Throat: Oropharynx is clear and moist. No oropharyngeal exudate.  Eyes: Conjunctivae and EOM are normal. Pupils are equal, round, and reactive to light. Right eye exhibits no discharge. Left eye exhibits no discharge.  Neck: Normal range of motion. Neck supple. No tracheal deviation present. No thyromegaly present.  Cardiovascular: Normal rate, regular rhythm, normal heart sounds and intact distal pulses.  Exam reveals no friction rub.   No murmur heard. Respiratory: Effort normal and breath sounds normal. No respiratory distress. He has no wheezes.  GI: Soft. Bowel sounds are normal. He exhibits no distension. There is no tenderness.  Musculoskeletal: Normal range of motion. He exhibits no edema and no tenderness.  Neurological: He is alert and oriented to person, place, and time. He has normal reflexes. He displays normal reflexes. No cranial nerve deficit. He exhibits normal muscle tone. Coordination normal.  Positive SLR Bilat.  Mild EHL weakness on right  Skin: Skin is warm and dry. No rash noted. He is not diaphoretic. No erythema. No pallor.  Psychiatric: He has a normal mood and affect. His behavior is normal. Judgment and thought content normal.     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 Plan reexploration of L4-5 laminectomy with redo laminectomy. I've explained to the patient there is high likelihood of iatrogenic destabilization in the process of fully decompressing his nerve roots. It is my opinion that if too much facet joint had to be removed for the surgery then he understands the surgery will also include posterior lumbar every fusion utilizing tangent interbody allograft wedge Telamon interbody peek  cage and local autograft. This will be supplemented with posterior lateral arthrodesis with pedicle screw fixation. I discussed the risks and benefits involved with surgery. Patient wishes to proceed.  Pamula Luther A 10/31/2012, 7:39 AM

## 2012-10-31 NOTE — Anesthesia Procedure Notes (Signed)
Procedure Name: Intubation Date/Time: 10/31/2012 7:57 AM Performed by: Arlice Colt B Pre-anesthesia Checklist: Patient identified, Emergency Drugs available, Suction available, Patient being monitored and Timeout performed Patient Re-evaluated:Patient Re-evaluated prior to inductionOxygen Delivery Method: Circle system utilized Preoxygenation: Pre-oxygenation with 100% oxygen Intubation Type: IV induction Ventilation: Two handed mask ventilation required and Oral airway inserted - appropriate to patient size Laryngoscope Size: Mac and 4 Grade View: Grade II Tube type: Oral Tube size: 7.5 mm Number of attempts: 1 Airway Equipment and Method: Stylet Placement Confirmation: ETT inserted through vocal cords under direct vision,  positive ETCO2 and breath sounds checked- equal and bilateral Secured at: 24 cm Tube secured with: Tape Dental Injury: Teeth and Oropharynx as per pre-operative assessment

## 2012-10-31 NOTE — Anesthesia Postprocedure Evaluation (Signed)
  Anesthesia Post-op Note  Patient: Corey Griffith  Procedure(s) Performed: Procedure(s): LUMBAR DECOMPRESSION  FOUR-FIVE ,POSTERIOR LUMBAR INTERBODY FUSION LUMBAR FOUR-FIVE (N/A)  Patient Location: PACU  Anesthesia Type:General  Level of Consciousness: awake and alert   Airway and Oxygen Therapy: Patient Spontanous Breathing and Patient connected to nasal cannula oxygen  Post-op Pain: moderate  Post-op Assessment: Post-op Vital signs reviewed, Patient's Cardiovascular Status Stable, Respiratory Function Stable, Patent Airway and No signs of Nausea or vomiting  Post-op Vital Signs: Reviewed and stable  Complications: No apparent anesthesia complications

## 2012-10-31 NOTE — Anesthesia Preprocedure Evaluation (Addendum)
Anesthesia Evaluation  Patient identified by MRN, date of birth, ID band Patient awake    Reviewed: Allergy & Precautions, H&P , NPO status , Patient's Chart, lab work & pertinent test results  Airway Mallampati: II TM Distance: >3 FB Neck ROM: Full    Dental no notable dental hx. (+) Teeth Intact and Dental Advisory Given   Pulmonary neg pulmonary ROS,  breath sounds clear to auscultation  Pulmonary exam normal       Cardiovascular hypertension, On Medications Rhythm:Regular Rate:Normal     Neuro/Psych Seizures -, Well Controlled,  negative psych ROS   GI/Hepatic Neg liver ROS, GERD-  Medicated and Controlled,  Endo/Other  Morbid obesity  Renal/GU Renal disease  negative genitourinary   Musculoskeletal   Abdominal   Peds  Hematology negative hematology ROS (+)   Anesthesia Other Findings   Reproductive/Obstetrics negative OB ROS                          Anesthesia Physical Anesthesia Plan  ASA: III  Anesthesia Plan: General   Post-op Pain Management:    Induction: Intravenous  Airway Management Planned: Oral ETT  Additional Equipment:   Intra-op Plan:   Post-operative Plan: Extubation in OR  Informed Consent: I have reviewed the patients History and Physical, chart, labs and discussed the procedure including the risks, benefits and alternatives for the proposed anesthesia with the patient or authorized representative who has indicated his/her understanding and acceptance.   Dental advisory given  Plan Discussed with: CRNA  Anesthesia Plan Comments:         Anesthesia Quick Evaluation

## 2012-10-31 NOTE — Transfer of Care (Signed)
Immediate Anesthesia Transfer of Care Note  Patient: Corey Griffith  Procedure(s) Performed: Procedure(s): LUMBAR DECOMPRESSION  FOUR-FIVE ,POSTERIOR LUMBAR INTERBODY FUSION LUMBAR FOUR-FIVE (N/A)  Patient Location: PACU  Anesthesia Type:General  Level of Consciousness: awake, alert  and oriented  Airway & Oxygen Therapy: Patient Spontanous Breathing and Patient connected to nasal cannula oxygen  Post-op Assessment: Report given to PACU RN and Post -op Vital signs reviewed and stable  Post vital signs: Reviewed and stable  Complications: No apparent anesthesia complications

## 2012-10-31 NOTE — Op Note (Signed)
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  L4-5 posterior lumbar interbody fusion utilizing capstone peek cages and morselized autograft  L4-5 posterior lateral arthrodesis utilizing segmental pedicle screw instrumentation and local autograft.  Surgeon:Chonita Gadea A.Lavon Horn, M.D.  Asst. 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 Patient initially did well following surgery but had recurrent severe back pain and bilateral radicular symptoms with increasing his activity. Workup has demonstrated evidence of severe restenosis at L45. The patient has failed conservative management including therapy and injections. The patient presents now for L4-5 decompressive surgery. I explained to him that the nature of the decompressive surgery could potentially be destabilizing secondary to his severely hypertrophied facet joints and extreme bilateral foraminal stenosis. I explained that decompression of his exiting L4 nerve roots bilaterally may lead to instability that may require lumbar fusion. The patient is aware of this and wishes to proceed with surgery in hopes of improving his symptoms.  Operative note: After induction anesthesia, the patient is positioned prone onto a Wilson  frame and he is appropriately padded. Lumbar region prepped and draped. Incision made overlying the L4-5 interspace. Bilateral subperiosteal dissection performed exposing the lamina and facet joints of L4 and L5 bilaterally. Deep self retaining retractors placed x-ray was used and the level was confirmed. Previous decompressive laminectomy site was dissected free using dental instruments and epidural scar was resected. During this dissection it became apparent that the patient had fractures of his pars interarticularis bilaterally creating instability at the L4-5 joint. This was an unexpected finding based on his preoperative imaging. I had been prepared to proceed with lumbar fusion if I had felt that the decompression required a destabilizing amount of facetectomy, however now I was placed with certain instability at L4-5 which was iatrogenic in nature following his previous surgery. At this point I decided proceed with his decompression with the addition of both posterior lumbar interbody fusion and posterior lateral fusion in hopes of giving him his best outcome.  Decompressive laminectomy was performed using Leksell rongeurs Kerrison or his high-speed drill to remove the entire residual lamina of L4. The fractured inferior facets of L4 were dissected free and resected completely. Superior facets of L5 were also resected as was ligamentum flavum and epidural scar. Wide decompressive foraminotomies were performed along the course the exiting L4 and L5 nerve roots bilaterally. All bone was cleaned in use and later autografting. Bilateral discectomies were then performed at L4-5 with the disc space distraction up to 14 mm. With a 14 mm distractor less than patient's left side the patient's thecal sac and nerve roots on the right side was protected and the disc space was incised a 15 blade in a rectangular fashion. Discectomy was then performed. The spaces and prepared for fusion utilizing large tangent  instruments. Soft tissue was removed and interspace. A 14 mm x 22 mm capstone peek cage packed with morselized autograft was impacted into place and recessed approximately 1-2 mm from the  posterior cortical margin of L4. Distractor was removed and patient's left side. Thecal sac and nerve respect on the left side. The spaces and reamed and cut with large tangent instruments. Soft tissue was removed from the interspace. The spaces further curettage. Morselize autograft was packed in the interspace for later fusion. A second 14 x 22 mm capstone cage was impacted in place recessed roughly 1-2 mm from the posterior cortical margin of L4. Pedicles of L4 and L5 were then made then applied using surface landmarks. Superficial bone was then removed over the the pedicle using a high-speed drill. Pedicle was then probed using a pedicle awl. Each pedicle awl track was then tapped with a screw tap. Each hole was then probed and found to be solidly within the bone. 6.75 x 45 mm radius brand screws were placed bilaterally at L4 6.75 x 40 mm screws were placed bilaterally at L5. Transverse processes of L4 and L5 were then decorticated using high-speed drill. Morselized autograft was packed posterior laterally for later fusion. Short segment titanium rods and posterior the screw heads at L4 and L5. Locking caps and placed over the screws were locking caps and engaged with the construct under compression. Final images revealed good position the bone graft and hardware at the proper upper level with normal alignment of the spine. Wound is then irrigated one final time. Hemostasis was assured with bipolar try repaired wounds and closed in layers. A medium Hemovac drain was left in epidural space. Steri-Strips and sterile dressing were applied. There were no apparent complications. Patient tolerated the procedure well and he returns to the recovery room postop.

## 2012-10-31 NOTE — Brief Op Note (Signed)
10/31/2012  10:56 AM  PATIENT:  Gertie Gowda  57 y.o. male  PRE-OPERATIVE DIAGNOSIS:  stenosis  POST-OPERATIVE DIAGNOSIS:  stenosis  PROCEDURE:  Procedure(s): LUMBAR DECOMPRESSION  FOUR-FIVE ,POSTERIOR LUMBAR INTERBODY FUSION LUMBAR FOUR-FIVE (N/A)  SURGEON:  Surgeon(s) and Role:    * Temple Pacini, MD - Primary  PHYSICIAN ASSISTANT:   ASSISTANTS: Stern   ANESTHESIA:   general  EBL:  Total I/O In: Amanda.Sat [I.V.:2000; Blood:215; IV Piggyback:250] Out: 725 [Urine:125; Blood:600]  BLOOD ADMINISTERED:none  DRAINS: (Medium) Hemovact drain(s) in the Epidural space with  Suction Open   LOCAL MEDICATIONS USED:  MARCAINE     SPECIMEN:  No Specimen  DISPOSITION OF SPECIMEN:  N/A  COUNTS:  YES  TOURNIQUET:  * No tourniquets in log *  DICTATION: .Dragon Dictation  PLAN OF CARE: Admit to inpatient   PATIENT DISPOSITION:  PACU - hemodynamically stable.   Delay start of Pharmacological VTE agent (>24hrs) due to surgical blood loss or risk of bleeding: yes

## 2012-11-01 NOTE — Evaluation (Addendum)
Occupational Therapy Evaluation Patient Details Name: Corey Griffith MRN: [id number] DOB: [birth year] Today's Date: 11/01/2012 Time: 4098-1191 OT Time Calculation (min): 32 min  OT Assessment / Plan / Recommendation History of present illness Patient is a 57 yo male s/p PLIF L4-5.  Patient had back surgery 1 year ago - now with recurrent pain.   Clinical Impression   Pt presents with problems listed below. Pt will benefit from acute OT to increase independence prior to d/c home. Pt required assistance from wife for dressing after getting off at work. Wife will be at home to assist pt 24/7.   OT Assessment  Patient needs continued OT Services    Follow Up Recommendations  Supervision/Assistance - 24 hour;No OT follow up    Barriers to Discharge      Equipment Recommendations  3 in 1 bedside comode- may need large size  Recommendations for Other Services    Frequency  Min 2X/week    Precautions / Restrictions Precautions Precautions: Back;Fall Precaution Booklet Issued: No Precaution Comments: Patient had multiple falls at home.  Used quad cane.  Reviewed back precautions. Required Braces or Orthoses: Spinal Brace Spinal Brace: Lumbar corset;Applied in sitting position Restrictions Weight Bearing Restrictions: No   Pertinent Vitals/Pain Pt states 7/10 pain, and wife states that is really a 10/10 pain due to his pain tolerance. Repositioned in chair.     ADL  Eating/Feeding: Independent Where Assessed - Eating/Feeding: Chair Grooming: Set up;Supervision/safety Where Assessed - Grooming: Supported sitting Upper Body Bathing: Set up;Supervision/safety Where Assessed - Upper Body Bathing: Unsupported sitting Lower Body Bathing: Maximal assistance Where Assessed - Lower Body Bathing: Supported sit to stand Upper Body Dressing: Set up;Supervision/safety Where Assessed - Upper Body Dressing: Unsupported sitting Lower Body Dressing: Maximal assistance Where Assessed - Lower  Body Dressing: Supported sit to stand Toilet Transfer: Min Pension scheme manager Method: Sit to Barista: Raised toilet seat with arms (or 3-in-1 over toilet) Tub/Shower Transfer Method: Not assessed Equipment Used: Back brace;Gait belt;Rolling walker;Sock aid;Reacher;Long-handled sponge;Long-handled shoe horn Transfers/Ambulation Related to ADLs: Min guard ADL Comments: OT assisted with positioning brace around pt's back as appeared to be twisting. Pt practiced toilet transfer on 3 in 1. OT educated on using 2 cups for teeth care to help maintain precautions. OT demonstrated use of AE for ADLs as pt could not fully cross legs for LB dressing. OT educated on use of toilet tongs for hygiene. Discussed tub and shower setup and do not think walker and chair will fit in shower and tub setup will not work either. Stepped back in room a little later and discussed that pt could use a lawn chair and step in sideways, but still unsure if this will work, but may be option.     OT Diagnosis: Acute pain  OT Problem List: Decreased strength;Decreased range of motion;Decreased activity tolerance;Decreased knowledge of use of DME or AE;Decreased knowledge of precautions;Pain OT Treatment Interventions: Self-care/ADL training;DME and/or AE instruction;Therapeutic activities;Patient/family education;Balance training   OT Goals(Current goals can be found in the care plan section) Acute Rehab OT Goals Patient Stated Goal: have less pain OT Goal Formulation: With patient Time For Goal Achievement: 11/08/12 Potential to Achieve Goals: Good ADL Goals Pt Will Perform Grooming: with modified independence;standing Pt Will Perform Lower Body Bathing: with modified independence;with caregiver independent in assisting;with adaptive equipment;sit to/from stand Pt Will Perform Lower Body Dressing: with modified independence;with caregiver independent in assisting;with adaptive equipment;sit to/from  stand Pt Will Transfer to Toilet: with modified  independence;ambulating (3 in 1 over commode) Pt Will Perform Toileting - Clothing Manipulation and hygiene: with modified independence;with adaptive equipment;sit to/from stand  Visit Information  Last OT Received On: 11/01/12 Assistance Needed: +1 History of Present Illness: Patient is a 57 yo male s/p PLIF L4-5.  Patient had back surgery 1 year ago - now with recurrent pain.       Prior Functioning     Home Living Family/patient expects to be discharged to:: Private residence Living Arrangements: Spouse/significant other;Children Available Help at Discharge: Family;Available 24 hours/day Type of Home: House Home Access: Ramped entrance Home Layout: One level Home Equipment: Cane - quad Prior Function Level of Independence: Independent with assistive device(s);Needs assistance ADL's / Homemaking Assistance Needed: wife assisted a lot with dressing after getting off work Communication Communication: No difficulties         Vision/Perception     Copywriter, advertising Arousal/Alertness: Awake/alert Behavior During Therapy: WFL for tasks assessed/performed Overall Cognitive Status: Within Functional Limits for tasks assessed    Extremity/Trunk Assessment Upper Extremity Assessment Upper Extremity Assessment: Overall WFL for tasks assessed     Mobility Bed Mobility Bed Mobility: Rolling Left;Left Sidelying to Sit;Sitting - Scoot to Edge of Bed Rolling Left: 4: Min guard Left Sidelying to Sit: 4: Min guard;HOB flat Sitting - Scoot to Edge of Bed: 5: Supervision Details for Bed Mobility Assistance: cues for technique and for precautions.  Transfers Transfers: Sit to Stand;Stand to Sit Sit to Stand: 4: Min guard;With upper extremity assist;From bed;From chair/3-in-1 Stand to Sit: 4: Min guard;With upper extremity assist;To chair/3-in-1 Details for Transfer Assistance: cues for hand placement and technique.       Exercise     Balance     End of Session OT - End of Session Equipment Utilized During Treatment: Gait belt;Rolling walker;Back brace Activity Tolerance: Patient tolerated treatment well Patient left: in chair;with call bell/phone within reach;with family/visitor present  GO     Earlie Raveling OTR/L [phone] 11/01/2012, 4:37 PM

## 2012-11-01 NOTE — Progress Notes (Signed)
Patient foley removed at 2230. Fluids encouraged. Patient educated on proper back precautions and how to apply brace. Patient ambulated down hall. Minimal pain, steady gait. Will continue to monitor.

## 2012-11-01 NOTE — Progress Notes (Signed)
Patient ambulated down hall with RN. Tolerated well with walker. Minimal pain. Voiding since foley is out. Hemovac drained 40 ml.

## 2012-11-01 NOTE — Progress Notes (Signed)
No issues overnight. Pt doing well, no c/o new numbness/tingling/weakness. Pain controlled with current medications.  EXAM:  BP 125/67  Pulse 89  Temp(Src) 97.8 F (36.6 C) (Oral)  Resp 20  Ht 6\' 2"  (1.88 m)  Wt 139.708 kg (308 lb)  BMI 39.53 kg/m2  SpO2 99%  Awake, alert, oriented  Speech fluent, appropriate  CN grossly intact  5/5 BUE/BLE  Wound c/d/i, drain in place with 365cc output   IMPRESSION:  57 y.o. male POD# 1 s/p L4-5 PLIF Doing well neurologically  PLAN: - Routine postop care - OOB with brace - Cont hemovac drain

## 2012-11-01 NOTE — Evaluation (Signed)
Physical Therapy Evaluation Patient Details Name: Corey Griffith MRN: [id number] DOB: 05/05/1955 Today's Date: 11/01/2012 Time: 4098-1191 PT Time Calculation (min): 38 min  PT Assessment / Plan / Recommendation History of Present Illness  Patient is a 57 yo male s/p PLIF L4-5.  Patient had back surgery 1 year ago - now with recurrent pain.  Clinical Impression  Patient presents with problems listed below.  Will benefit from acute PT to maximize independence prior to discharge home.    PT Assessment  Patient needs continued PT services    Follow Up Recommendations  No PT follow up;Supervision/Assistance - 24 hour    Does the patient have the potential to tolerate intense rehabilitation      Barriers to Discharge        Equipment Recommendations  Rolling walker with 5" wheels;3in1 (PT)    Recommendations for Other Services     Frequency Min 5X/week    Precautions / Restrictions Precautions Precautions: Back;Fall Precaution Booklet Issued: Yes (comment) Precaution Comments: Patient had multiple falls at home.  Used quad cane.  Provided handout and education on back precautions. Required Braces or Orthoses: Spinal Brace Spinal Brace: Lumbar corset;Applied in sitting position Restrictions Weight Bearing Restrictions: No   Pertinent Vitals/Pain Pain limiting mobility      Mobility  Bed Mobility Bed Mobility: Rolling Left;Left Sidelying to Sit;Sit to Sidelying Left Rolling Left: 5: Supervision;With rail Left Sidelying to Sit: 4: Min guard;HOB flat Sit to Sidelying Left: 4: Min guard;HOB flat Details for Bed Mobility Assistance: Verbal cues for technique to maintain back precautions.  Instructed patient on donning back brace in sitting. Transfers Transfers: Sit to Stand;Stand to Sit Sit to Stand: 5: Supervision;With upper extremity assist;From bed Stand to Sit: 5: Supervision;With upper extremity assist;To bed Details for Transfer Assistance: Verbal cues for hand  placement and technique. Ambulation/Gait Ambulation/Gait Assistance: 4: Min guard Ambulation Distance (Feet): 120 Feet Assistive device: Rolling walker Ambulation/Gait Assistance Details: Verbal cues for safe use of RW.  Cues for upright posture and to look forward during gait. Gait Pattern: Step-through pattern;Decreased stride length;Trunk flexed Gait velocity: Slow gait speed    Exercises     PT Diagnosis: Difficulty walking;Generalized weakness;Acute pain  PT Problem List: Decreased strength;Decreased balance;Decreased activity tolerance;Decreased mobility;Decreased knowledge of use of DME;Decreased knowledge of precautions;Pain PT Treatment Interventions: DME instruction;Gait training;Functional mobility training;Patient/family education;Balance training     PT Goals(Current goals can be found in the care plan section) Acute Rehab PT Goals Patient Stated Goal: To decrease pain PT Goal Formulation: With patient/family Time For Goal Achievement: 11/08/12 Potential to Achieve Goals: Good  Visit Information  Last PT Received On: 11/01/12 Assistance Needed: +1 History of Present Illness: Patient is a 57 yo male s/p PLIF L4-5.  Patient had back surgery 1 year ago - now with recurrent pain.       Prior Functioning  Home Living Family/patient expects to be discharged to:: Private residence Living Arrangements: Spouse/significant other;Children Available Help at Discharge: Family;Available 24 hours/day Type of Home: House Home Access: Ramped entrance Home Layout: One level Home Equipment: Cane - quad Prior Function Level of Independence: Independent with assistive device(s);Needs assistance ADL's / Homemaking Assistance Needed: Assist with lower body dressing Communication Communication: No difficulties    Cognition  Cognition Arousal/Alertness: Awake/alert Behavior During Therapy: WFL for tasks assessed/performed Overall Cognitive Status: Within Functional Limits for  tasks assessed    Extremity/Trunk Assessment Upper Extremity Assessment Upper Extremity Assessment: Overall WFL for tasks assessed Lower Extremity Assessment Lower Extremity Assessment:  Generalized weakness   Balance    End of Session PT - End of Session Equipment Utilized During Treatment: Gait belt;Back brace Activity Tolerance: Patient limited by pain;Patient limited by fatigue Patient left: in bed;with call bell/phone within reach;with family/visitor present Nurse Communication: Mobility status  GP     Vena Austria 11/01/2012, 11:09 AM Durenda Hurt. Renaldo Fiddler, [facility] Acute Rehab Services Pager [phone]

## 2012-11-02 NOTE — Progress Notes (Signed)
Physical Therapy Treatment Patient Details Name: Corey Griffith MRN: [id number] DOB: [birth year] Today's Date: 11/02/2012 Time: 5621-3086 PT Time Calculation (min): 24 min  PT Assessment / Plan / Recommendation  History of Present Illness Patient is a 57 yo male s/p PLIF L4-5.  Patient had back surgery 1 year ago - now with recurrent pain.   PT Comments   Pt agreeable to participate in therapy.  Reports pain a little more intense today than it has been & that he's moving a little slower today compared to previous PT session.     Follow Up Recommendations  No PT follow up;Supervision/Assistance - 24 hour     Does the patient have the potential to tolerate intense rehabilitation     Barriers to Discharge        Equipment Recommendations  Rolling walker with 5" wheels;3in1 (PT)    Recommendations for Other Services    Frequency Min 5X/week   Progress towards PT Goals Progress towards PT goals: Progressing toward goals  Plan Current plan remains appropriate    Precautions / Restrictions Precautions Precautions: Back;Fall Precaution Booklet Issued: No Precaution Comments: Patient had multiple falls at home.  Used quad cane.  Reviewed back precautions. Required Braces or Orthoses: Spinal Brace Spinal Brace: Lumbar corset;Applied in sitting position Restrictions Weight Bearing Restrictions: No   Pertinent Vitals/Pain 8/10 back & across hips.  RN states pain medication not due at this time.      Mobility  Bed Mobility Bed Mobility: Sit to Sidelying Left Sit to Sidelying Left: 5: Supervision;HOB flat Details for Bed Mobility Assistance: cues to reinforce back precautions & technique. Incr time but no physical assist needed.   Transfers Transfers: Sit to Stand;Stand to Sit Sit to Stand: 4: Min guard;With upper extremity assist;From bed Stand to Sit: 4: Min guard;With upper extremity assist;To bed Details for Transfer Assistance: cues for safe hand placement & to decrease trunk  flexion Ambulation/Gait Ambulation/Gait Assistance: 4: Min guard Ambulation Distance (Feet): 100 Feet Assistive device: Rolling walker Ambulation/Gait Assistance Details: Distance limited by pain.  Pt steady but very tense/rigid posture, small & slow steps.   Gait Pattern: Step-through pattern;Decreased stride length Gait velocity: Slow gait speed Stairs: No Wheelchair Mobility Wheelchair Mobility: No      PT Goals (current goals can now be found in the care plan section) Acute Rehab PT Goals Patient Stated Goal: have less pain PT Goal Formulation: With patient/family Time For Goal Achievement: 11/08/12 Potential to Achieve Goals: Good  Visit Information  Last PT Received On: 11/02/12 Assistance Needed: +1 History of Present Illness: Patient is a 57 yo male s/p PLIF L4-5.  Patient had back surgery 1 year ago - now with recurrent pain.    Subjective Data  Patient Stated Goal: have less pain   Cognition  Cognition Arousal/Alertness: Awake/alert Behavior During Therapy: WFL for tasks assessed/performed Overall Cognitive Status: Within Functional Limits for tasks assessed    Balance     End of Session PT - End of Session Equipment Utilized During Treatment: Back brace;Gait belt Activity Tolerance: Patient limited by pain;Patient limited by fatigue Patient left: in bed;with call bell/phone within reach;with family/visitor present Nurse Communication: Mobility status   GP     Lara Mulch 11/02/2012, 9:10 AM  Verdell Face, PTA [phone] 11/02/2012

## 2012-11-02 NOTE — Progress Notes (Signed)
Occupational Therapy Treatment Patient Details Name: Corey Griffith MRN: [id number] DOB: 12-18-1955 Today's Date: 11/02/2012 Time: 7829-5621 OT Time Calculation (min): 34 min  OT Assessment / Plan / Recommendation  History of present illness Patient is a 57 yo male s/p PLIF L4-5.  Patient had back surgery 1 year ago - now with recurrent pain.   OT comments  Practiced with reacher and sockaid, performed grooming at sink, and toileting. Also, practiced donning/doffing back brace and performed bed mobility. Pt in lots of pain at end of session.   Follow Up Recommendations  Supervision/Assistance - 24 hour;No OT follow up    Barriers to Discharge       Equipment Recommendations  3 in 1 bedside comode    Recommendations for Other Services    Frequency Min 2X/week   Progress towards OT Goals Progress towards OT goals: Progressing toward goals  Plan Discharge plan remains appropriate    Precautions / Restrictions Precautions Precautions: Back;Fall Precaution Booklet Issued: No Precaution Comments: Patient had multiple falls at home.  Used quad cane.  Reviewed back precautions. Required Braces or Orthoses: Spinal Brace Spinal Brace: Lumbar corset;Applied in sitting position Restrictions Weight Bearing Restrictions: No   Pertinent Vitals/Pain Pain 12/10. Nurse gave pain meds after session.    ADL  Grooming: Set up;Supervision/safety;Wash/dry face;Brushing hair Where Assessed - Grooming: Supported standing Upper Body Dressing: Minimal assistance;Set up;Supervision/safety Where Assessed - Upper Body Dressing: Unsupported sitting Lower Body Dressing: Min guard Where Assessed - Lower Body Dressing: Supported sit to stand Toilet Transfer: Personnel officer Method: Other (comment);Sit to stand (also stood at toilet to urinate) Acupuncturist: Comfort height toilet;Other (comment) (stood at toilet; sit to stand from recliner chair and  bed) Toileting - Architect and Hygiene: Supervision/safety Where Assessed - Toileting Clothing Manipulation and Hygiene: Standing Equipment Used: Back brace;Gait belt;Rolling walker;Reacher;Long-handled sponge;Sock aid Transfers/Ambulation Related to ADLs: Min guard for ambulation. Min guard for sit to stand; Supervision while standing at toilet to urinate ADL Comments: Pt practiced with reacher and sockaid for LB dressing-cues for precautions. Pt performed grooming at sink and also stood at toilet to urinate. OT demonstrated use of long handled sponge. Pt donned/doffed brace-cues for precautions. Brace got caught on drain, so assistance was given when doffing brace-however, pt will not have drain on at home, so pt overall setup/supervision level for donning/doffing brace. Cues to maintain precautions during session. OT educated on safety with ADLs such as having chair/bed behind pt with walker in front and wife with him when pulling up LB clothing.     OT Diagnosis:    OT Problem List:   OT Treatment Interventions:     OT Goals(current goals can now be found in the care plan section) Acute Rehab OT Goals Patient Stated Goal: have less pain OT Goal Formulation: With patient Time For Goal Achievement: 11/08/12 Potential to Achieve Goals: Good ADL Goals Pt Will Perform Grooming: with modified independence;standing Pt Will Perform Lower Body Bathing: with modified independence;with caregiver independent in assisting;with adaptive equipment;sit to/from stand Pt Will Perform Lower Body Dressing: with modified independence;with caregiver independent in assisting;with adaptive equipment;sit to/from stand Pt Will Transfer to Toilet: with modified independence;ambulating (3 in 1 over commode) Pt Will Perform Toileting - Clothing Manipulation and hygiene: with modified independence;with adaptive equipment;sit to/from stand  Visit Information  Last OT Received On: 11/02/12 Assistance  Needed: +1 History of Present Illness: Patient is a 57 yo male s/p PLIF L4-5.  Patient had back surgery 1 year  ago - now with recurrent pain.    Subjective Data      Prior Functioning       Cognition  Cognition Arousal/Alertness: Awake/alert Behavior During Therapy: WFL for tasks assessed/performed Overall Cognitive Status: Within Functional Limits for tasks assessed    Mobility  Bed Mobility Bed Mobility: Rolling Left;Left Sidelying to Sit;Sitting - Scoot to [place] of Bed;Sit to Sidelying Left;Rolling Right Rolling Right: 3: Mod assist Rolling Left: 4: Min guard Left Sidelying to Sit: 4: Min guard;HOB flat Sitting - Scoot to Edge of Bed: 5: Supervision Sit to Sidelying Left: 4: Min guard;HOB flat Details for Bed Mobility Assistance: cues for technique and precautions. Mod A to roll onto back to try to keep body in alignment. Transfers Transfers: Sit to Stand;Stand to Sit Sit to Stand: 4: Min guard;With upper extremity assist;With armrests;From chair/3-in-1;From bed Stand to Sit: 4: Min guard;With upper extremity assist;With armrests;To bed;To chair/3-in-1 Details for Transfer Assistance: cues for hand placement and precautions.    Exercises      Balance     End of Session OT - End of Session Equipment Utilized During Treatment: Gait belt;Rolling walker;Back brace Activity Tolerance: Patient tolerated treatment well;Patient limited by pain Patient left: with call bell/phone within reach;in bed;with family/visitor present;with nursing/sitter in room  GO     Earlie Raveling OTR/L [phone] 11/02/2012, 3:42 PM

## 2012-11-02 NOTE — Progress Notes (Signed)
Subjective: Patient reports improving, but still quite sore  Objective: Vital signs in last 24 hours: Temp:  [97.6 F (36.4 C)-98.4 F (36.9 C)] 97.9 F (36.6 C) (09/14 0600) Pulse Rate:  [89-105] 105 (09/14 0600) Resp:  [18-20] 18 (09/14 0600) BP: (105-126)/(47-73) 105/55 mmHg (09/14 0600) SpO2:  [92 %-99 %] 97 % (09/14 0600)  Intake/Output from previous day: 09/13 0701 - 09/14 0700 In: 3 [I.V.:3] Out: 1830 [Urine:1750; Drains:80] Intake/Output this shift:    Physical Exam: Full strength both legs.  Minimal spotting on bandage.  Hemovac 25 cc last night.  Lab Results: No results found for this basename: WBC, HGB, HCT, PLT,  in the last 72 hours BMET No results found for this basename: NA, K, CL, CO2, GLUCOSE, BUN, CREATININE, CALCIUM,  in the last 72 hours  Studies/Results: Dg Lumbar Spine 2-3 Views  10/31/2012   *RADIOLOGY REPORT*  Clinical Data: L4-5 PLIF  DG C-ARM 1-60 MIN,LUMBAR SPINE - 2-3 VIEW  Technique: AP and lateral C-arm images in the operating room  Comparison:  MRI 04/25/2012  Findings: Surgical placement of bilateral pedicle screws at L4-L5. Posterior rods not yet in place.  Interbody spacer in good position.  IMPRESSION: L4-5 PLIF as above.   Original Report Authenticated By: Janeece Riggers, M.D.   Dg C-arm 1-60 Min  10/31/2012   *RADIOLOGY REPORT*  Clinical Data: L4-5 PLIF  DG C-ARM 1-60 MIN,LUMBAR SPINE - 2-3 VIEW  Technique: AP and lateral C-arm images in the operating room  Comparison:  MRI 04/25/2012  Findings: Surgical placement of bilateral pedicle screws at L4-L5. Posterior rods not yet in place.  Interbody spacer in good position.  IMPRESSION: L4-5 PLIF as above.   Original Report Authenticated By: Janeece Riggers, M.D.    Assessment/Plan: D/C hemovac.  Mobilize, likely D/C in am.    LOS: 2 days    Dorian Heckle, MD 11/02/2012, 7:50 AM

## 2012-11-03 MED FILL — Heparin Sodium (Porcine) Inj 1000 Unit/ML: INTRAMUSCULAR | Qty: 30 | Status: AC

## 2012-11-03 MED FILL — Sodium Chloride IV Soln 0.9%: INTRAVENOUS | Qty: 2000 | Status: AC

## 2012-11-03 NOTE — Progress Notes (Signed)
Physical Therapy Treatment Patient Details Name: Corey Griffith MRN: [id number] DOB: Feb 08, 1956 Today's Date: 11/03/2012 Time: 4098-1191 PT Time Calculation (min): 32 min  PT Assessment / Plan / Recommendation  History of Present Illness Patient is a 57 yo male s/p PLIF L4-5.  Patient had back surgery 1 year ago - now with recurrent pain.   PT Comments   Pt's wife present throughout session. Pt reluctant at first to participate due to incr pain yesterday. Explained desire to review safe use of RW and ways to mobilize while maintaining back precautions and pt agreed to practice. Pt required minimal cues to adhere to back precautions; moderate cues for safe use of RW. Wife able to recall all instructions and reports she will be with him to help him. Educated pt/wife on likely need to stop during his ride home to stand and stretch due to length of ride home (1 1/2 hrs).   Follow Up Recommendations  No PT follow up;Supervision/Assistance - 24 hour     Does the patient have the potential to tolerate intense rehabilitation     Barriers to Discharge        Equipment Recommendations  Rolling walker with 5" wheels;3in1 (PT)    Recommendations for Other Services    Frequency Min 5X/week   Progress towards PT Goals Progress towards PT goals: Progressing toward goals  Plan Current plan remains appropriate    Precautions / Restrictions Precautions Precautions: Back;Fall Precaution Booklet Issued: No Precaution Comments: Patient had multiple falls at home.  Used quad cane.  Reviewed back precautions. Required Braces or Orthoses: Spinal Brace Spinal Brace: Lumbar corset;Applied in sitting position Restrictions Weight Bearing Restrictions: No   Pertinent Vitals/Pain 6/10 low back and not in legs (initially); at end of session 8/10 in back (not in legs); patient repositioned for comfort     Mobility  Bed Mobility Bed Mobility: Sit to Sidelying Left;Rolling Left;Left Sidelying to  Sit;Sitting - Scoot to [place] of Bed;Rolling Right Rolling Right: 4: Min assist Rolling Left: 5: Supervision Left Sidelying to Sit: 5: Supervision;HOB flat Sitting - Scoot to Edge of Bed: 6: Modified independent (Device/Increase time) Sit to Sidelying Left: 5: Supervision;HOB flat Details for Bed Mobility Assistance: cues to reinforce back precautions & technique. Incr time needed; rolling from Lt side to supine (to his Rt) he required assist to prevent twisting Transfers Transfers: Sit to Stand;Stand to Sit Sit to Stand: 4: Min guard;With upper extremity assist;From bed;5: Supervision;From chair/3-in-1 Stand to Sit: 4: Min guard;With upper extremity assist;To bed;To chair/3-in-1 Details for Transfer Assistance: vc for safe hand placement/safe use of RW; required assist to hold one side of RW when coming to stand from bed at lowest height (pt needed one hand on RW to come to stand) Ambulation/Gait Ambulation/Gait Assistance: 5: Supervision Ambulation Distance (Feet): 140 Feet Assistive device: Rolling walker Ambulation/Gait Assistance Details: vc for safe use of RW (keeping close to his body) Gait Pattern: Step-through pattern;Decreased stride length Gait velocity: Slow gait speed Stairs: No (discussed use of RW on ramp) Wheelchair Mobility Wheelchair Mobility: No    Exercises     PT Diagnosis:    PT Problem List:   PT Treatment Interventions:     PT Goals (current goals can now be found in the care plan section) Acute Rehab PT Goals Patient Stated Goal: have less pain  Visit Information  Last PT Received On: 11/03/12 Assistance Needed: +1 History of Present Illness: Patient is a 57 yo male s/p PLIF L4-5.  Patient had back  surgery 1 year ago - now with recurrent pain.    Subjective Data  Subjective: Pt reports he has 1 1/2 hour ride in car to get home and does not want to overdo anything today Patient Stated Goal: have less pain   Cognition  Cognition Arousal/Alertness:  Awake/alert Behavior During Therapy: WFL for tasks assessed/performed Overall Cognitive Status: Within Functional Limits for tasks assessed    Balance     End of Session PT - End of Session Equipment Utilized During Treatment: Back brace Activity Tolerance: Patient tolerated treatment well Patient left: in bed;with call bell/phone within reach;with family/visitor present   GP     Corey Griffith 11/03/2012, 2:25 PM Pager [phone]

## 2012-11-03 NOTE — Progress Notes (Signed)
Postop day 3. Patient reports that yesterday he had poor pain control but is doing a little better today. His pain remains predominantly incisional in nature. He is having no radicular symptoms. He is progressing slowly with therapy but his mobility remains marginal for discharge home.  Afebrile. Vitals are stable. Urine output good. Motor and sensory function intact. Chest abdomen benign.  Progressing reasonably well following lumbar decompression and fusion surgery. I anticipate discharge home tomorrow.

## 2012-11-03 NOTE — Progress Notes (Signed)
OT Cancellation Note  Patient Details Name: Corey Griffith MRN: [id number] DOB: 1955/09/09   Cancelled Treatment:    Reason Eval/Treat Not Completed: Other (comment) Pt refusing OT today due to him not wanting to be in pain on the way home if he discharges today.   Earlie Raveling OTR/L [phone] 11/03/2012, 2:06 PM

## 2012-11-03 NOTE — Care Management Note (Unsigned)
    Page 1 of 1   11/03/2012     10:56:53 AM   CARE MANAGEMENT NOTE 11/03/2012  Patient:  JAFARI, MCKILLOP   Account Number:  [id number]  Date Initiated:  11/03/2012  Documentation initiated by:  Elmer Bales  Subjective/Objective Assessment:   Patient admitted for PLIF. Lives at home with wife.     Action/Plan:   Will follow for discharge needs   Anticipated DC Date:  11/03/2012   Anticipated DC Plan:  HOME/SELF CARE      DC Planning Services  CM consult      Choice offered to / List presented to:     DME arranged  3-N-1  WALKER - ROLLING      DME agency  [facility]        Status of service:  Completed, signed off Medicare Important Message given?   (If response is "NO", the following Medicare IM given date fields will be blank) Date Medicare IM given:   Date Additional Medicare IM given:    Discharge Disposition:    Per UR Regulation:    If discussed at Long Length of Stay Meetings, dates discussed:    Comments:  11/03/12 1030 Elmer Bales RN, MSN, CM- Spoke with the call center for [facility] DME company regarding patient's orders for 3N1 and rolling walker.  Patient has requested both pieces of equipment be delivered to the hospital today prior to discharge.  Per call center, equipment delivery depends on the availability of delivery service today.  If it cannot be delivered today, equipment will be delivered to patient's home address.  [facility] does have a location that services the [place], [place] area.  Information was faxed to [facility] at [phone].

## 2012-11-03 NOTE — Progress Notes (Signed)
Utilization review complete. Azalya Galyon RN CCM Case Mgmt phone [phone] 

## 2012-11-04 MED ORDER — DIAZEPAM 5 MG PO TABS: 5.0000 mg | ORAL_TABLET | Freq: Four times a day (QID) | ORAL | Status: DC | PRN

## 2012-11-04 MED ORDER — OXYCODONE-ACETAMINOPHEN 10-325 MG PO TABS: 1.0000 | ORAL_TABLET | ORAL | Status: DC | PRN

## 2012-11-04 NOTE — Discharge Summary (Signed)
Physician Discharge Summary  Patient ID: Corey Griffith MRN: [id number] DOB/AGE: [birth year] 57 y.o.  Admit date: 10/31/2012 Discharge date: 11/04/2012  Admission Diagnoses:  Discharge Diagnoses:  Principal Problem:   Spinal stenosis, lumbar region, with neurogenic claudication   Discharged Condition: good  Hospital Course: Patient admitted to the hospital where he underwent uncomplicated L4-5 decompression and fusion. Postoperatively he is done well. Back and leg pain better. He is ready for discharge home.  Consults:   Significant Diagnostic Studies:   Treatments:   Discharge Exam: Blood pressure 113/75, pulse 108, temperature 98.8 F (37.1 C), temperature source Oral, resp. rate 18, height 6\' 2"  (1.88 m), weight 139.708 kg (308 lb), SpO2 90.00%. Awake and alert. Oriented and appropriate. Cranial nerve function intact. Motor and sensory function extremities normal. Wound clean and dry. Chest and abdomen benign.  Disposition: Final discharge disposition not confirmed     Medication List         AZOR 10-40 MG per tablet  Generic drug:  amLODipine-olmesartan  Take 1 tablet by mouth daily.     clonazePAM 1 MG tablet  Commonly known as:  KLONOPIN  Take 1 mg by mouth 2 (two) times daily as needed for anxiety.     cyclobenzaprine 10 MG tablet  Commonly known as:  FLEXERIL  Take 10 mg by mouth 2 (two) times daily.     DEXILANT 60 MG capsule  Generic drug:  dexlansoprazole  Take 60 mg by mouth daily.     diazepam 5 MG tablet  Commonly known as:  VALIUM  Take 1-2 tablets (5-10 mg total) by mouth every 6 (six) hours as needed.     DULoxetine 60 MG capsule  Commonly known as:  CYMBALTA  Take 60 mg by mouth 2 (two) times daily.     eszopiclone 2 MG Tabs tablet  Commonly known as:  LUNESTA  Take 2 mg by mouth at bedtime. Take immediately before bedtime     furosemide 40 MG tablet  Commonly known as:  LASIX  Take 40 mg by mouth daily as needed for fluid.     lidocaine 5 %  Commonly known as:  LIDODERM  Place 1 patch onto the skin daily. Remove & Discard patch within 12 hours or as directed by MD     oxyCODONE-acetaminophen 10-325 MG per tablet  Commonly known as:  PERCOCET  Take 1-2 tablets by mouth every 4 (four) hours as needed for pain.     OXYCONTIN 30 MG T12a  Generic drug:  OxyCODONE HCl ER  Take 30 mg by mouth every 12 (twelve) hours.     phenytoin 100 MG ER capsule  Commonly known as:  DILANTIN  Take 300-400 mg by mouth 2 (two) times daily. Take 4 capsules in the morning and 3 capsules in the evening     potassium chloride 10 MEQ tablet  Commonly known as:  K-DUR,KLOR-CON  Take 20 mEq by mouth daily.     Vitamin D (Ergocalciferol) 50000 UNITS Caps capsule  Commonly known as:  DRISDOL  Take 50,000 Units by mouth as needed.         Signed: Arthelia Callicott A 11/04/2012, 10:47 AM

## 2012-11-04 NOTE — Progress Notes (Signed)
Physical Therapy Treatment Patient Details Name: Corey Griffith MRN: [id number] DOB: 29-Apr-1955 Today's Date: 11/04/2012 Time: 4098-1191 PT Time Calculation (min): 5 min  PT Assessment / Plan / Recommendation  History of Present Illness Patient is a 57 yo male s/p PLIF L4-5.  Patient had back surgery 1 year ago - now with recurrent pain.   PT Comments   Pt/wife report MD recently in to see them and is discharging pt home. Pt returning to bed on arrival (was up with wife getting dressed to prepare for d/c home) and refused to demonstrate safe use of RW. Verbally reviewed back precautions and use of RW and together/combined pt and wife able to verbalize correct techniques and precautions. They have no further questions.  Follow Up Recommendations  No PT follow up;Supervision/Assistance - 24 hour     Does the patient have the potential to tolerate intense rehabilitation     Barriers to Discharge        Equipment Recommendations  Rolling walker with 5" wheels;3in1 (PT)    Recommendations for Other Services    Frequency     Progress towards PT Goals Progress towards PT goals: Progressing toward goals  Plan Current plan remains appropriate    Precautions / Restrictions Precautions Precautions: Back;Fall Precaution Comments: see Subjective Required Braces or Orthoses: Spinal Brace Spinal Brace: Lumbar corset;Applied in sitting position;Other (comment) Spinal Brace Comments: reviewed how pt can flip the "tightening" buckle back towards midline and cover with velcro to prevent having it stick out and get hung on something    Pertinent Vitals/Pain Not rated    Mobility       Exercises     PT Diagnosis:    PT Problem List:   PT Treatment Interventions:     PT Goals (current goals can now be found in the care plan section)    Visit Information  Last PT Received On: 11/04/12 Assistance Needed: +1 History of Present Illness: Patient is a 57 yo male s/p PLIF L4-5.  Patient had  back surgery 1 year ago - now with recurrent pain.    Subjective Data  Subjective: pt/wife report they have no further questions. Pt not able to state back precautions independently, however wife able to correct/assist him. pt able to verbalize correct way to use RW. He did not want to demonstrate as he's preparing to d/c home.   Cognition       Balance     End of Session PT - End of Session Patient left: in bed;with call bell/phone within reach;with family/visitor present   GP     Corey Griffith 11/04/2012, 11:05 AM Pager [phone]

## 2014-09-01 IMAGING — CR DG CHEST 2V
2 series · 2 of 2 positions shown · non-contrast
Comparison: None.

CLINICAL DATA: Preop for lumbar fusion. History of hypertension.

EXAM:
CHEST  2 VIEW

[w chest lat]
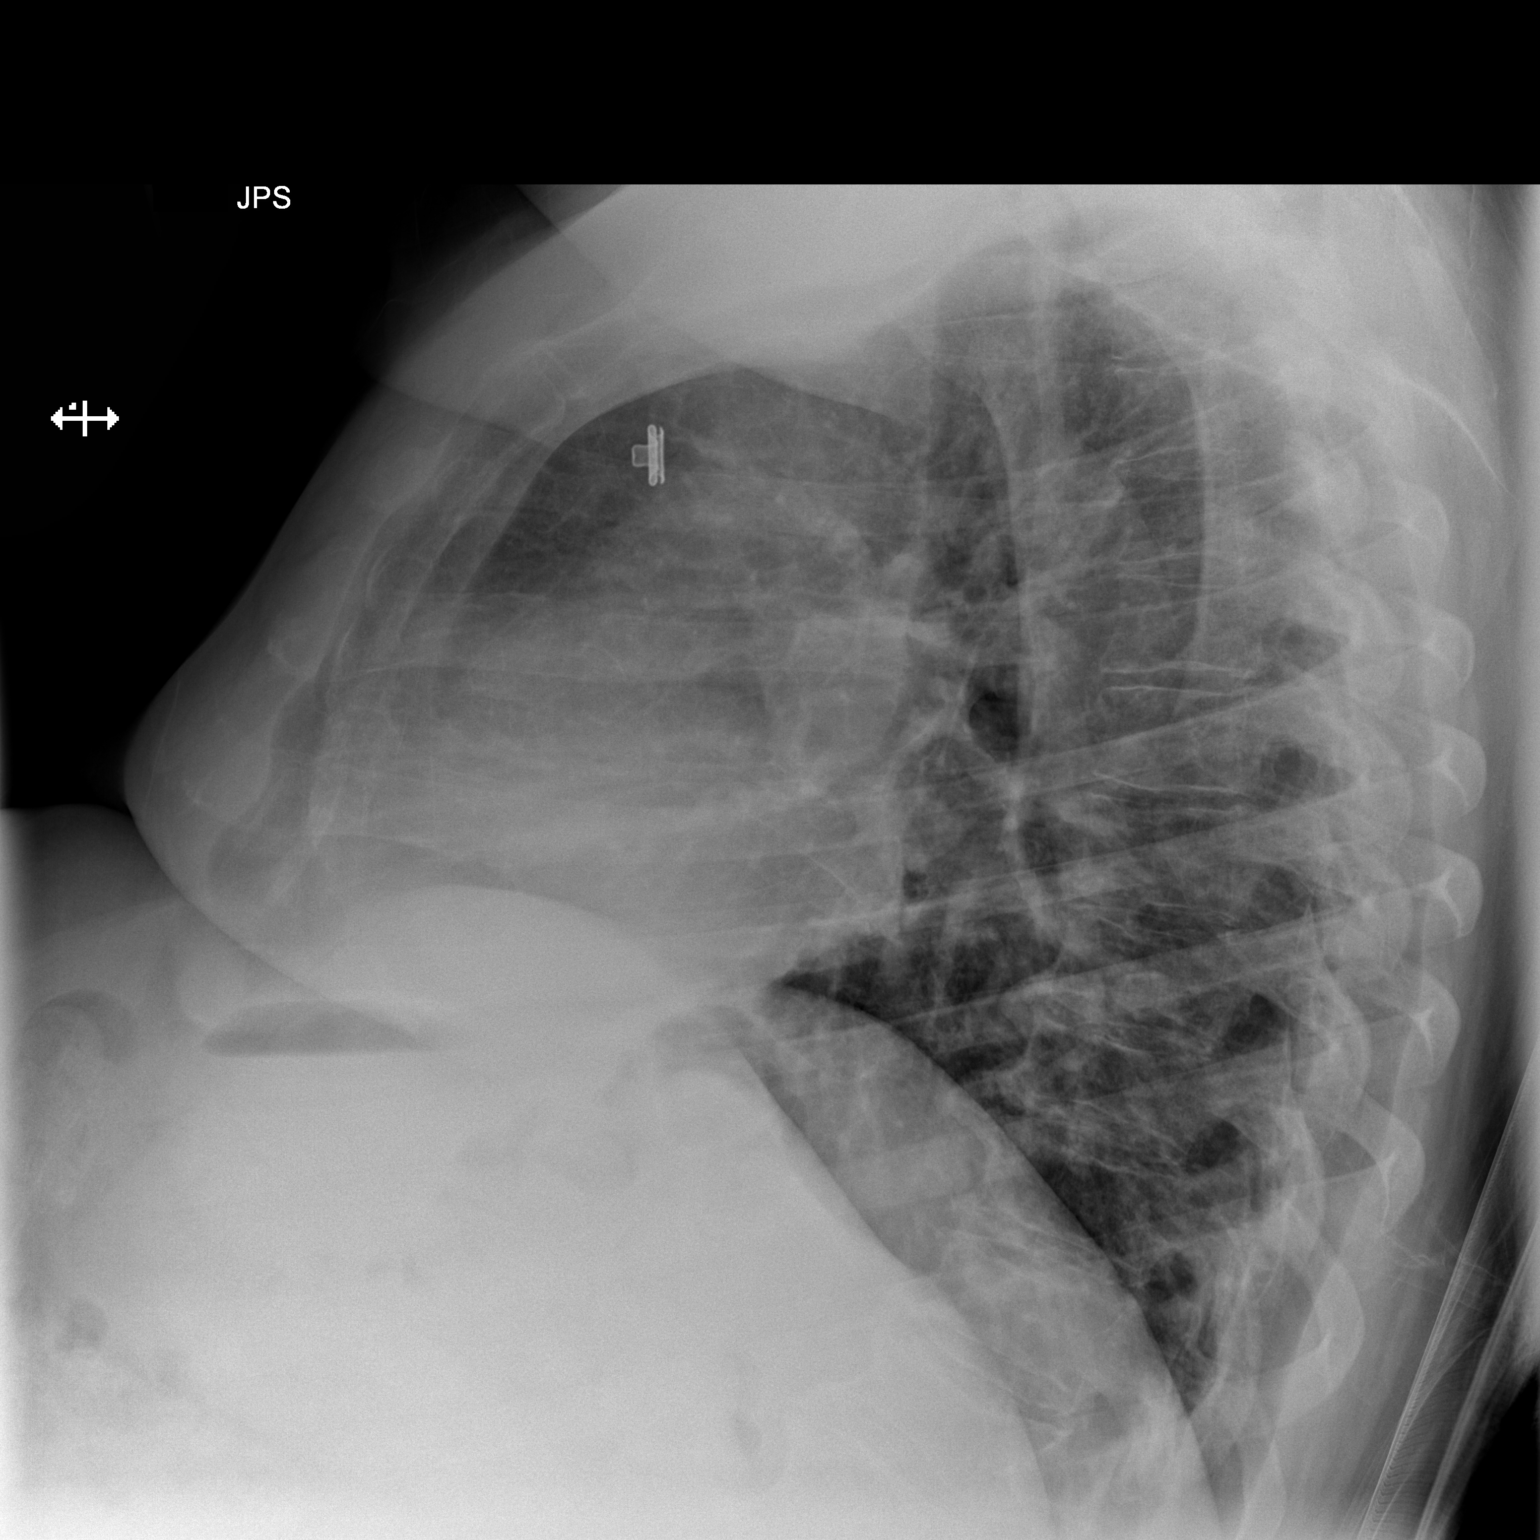

[w chest pa]
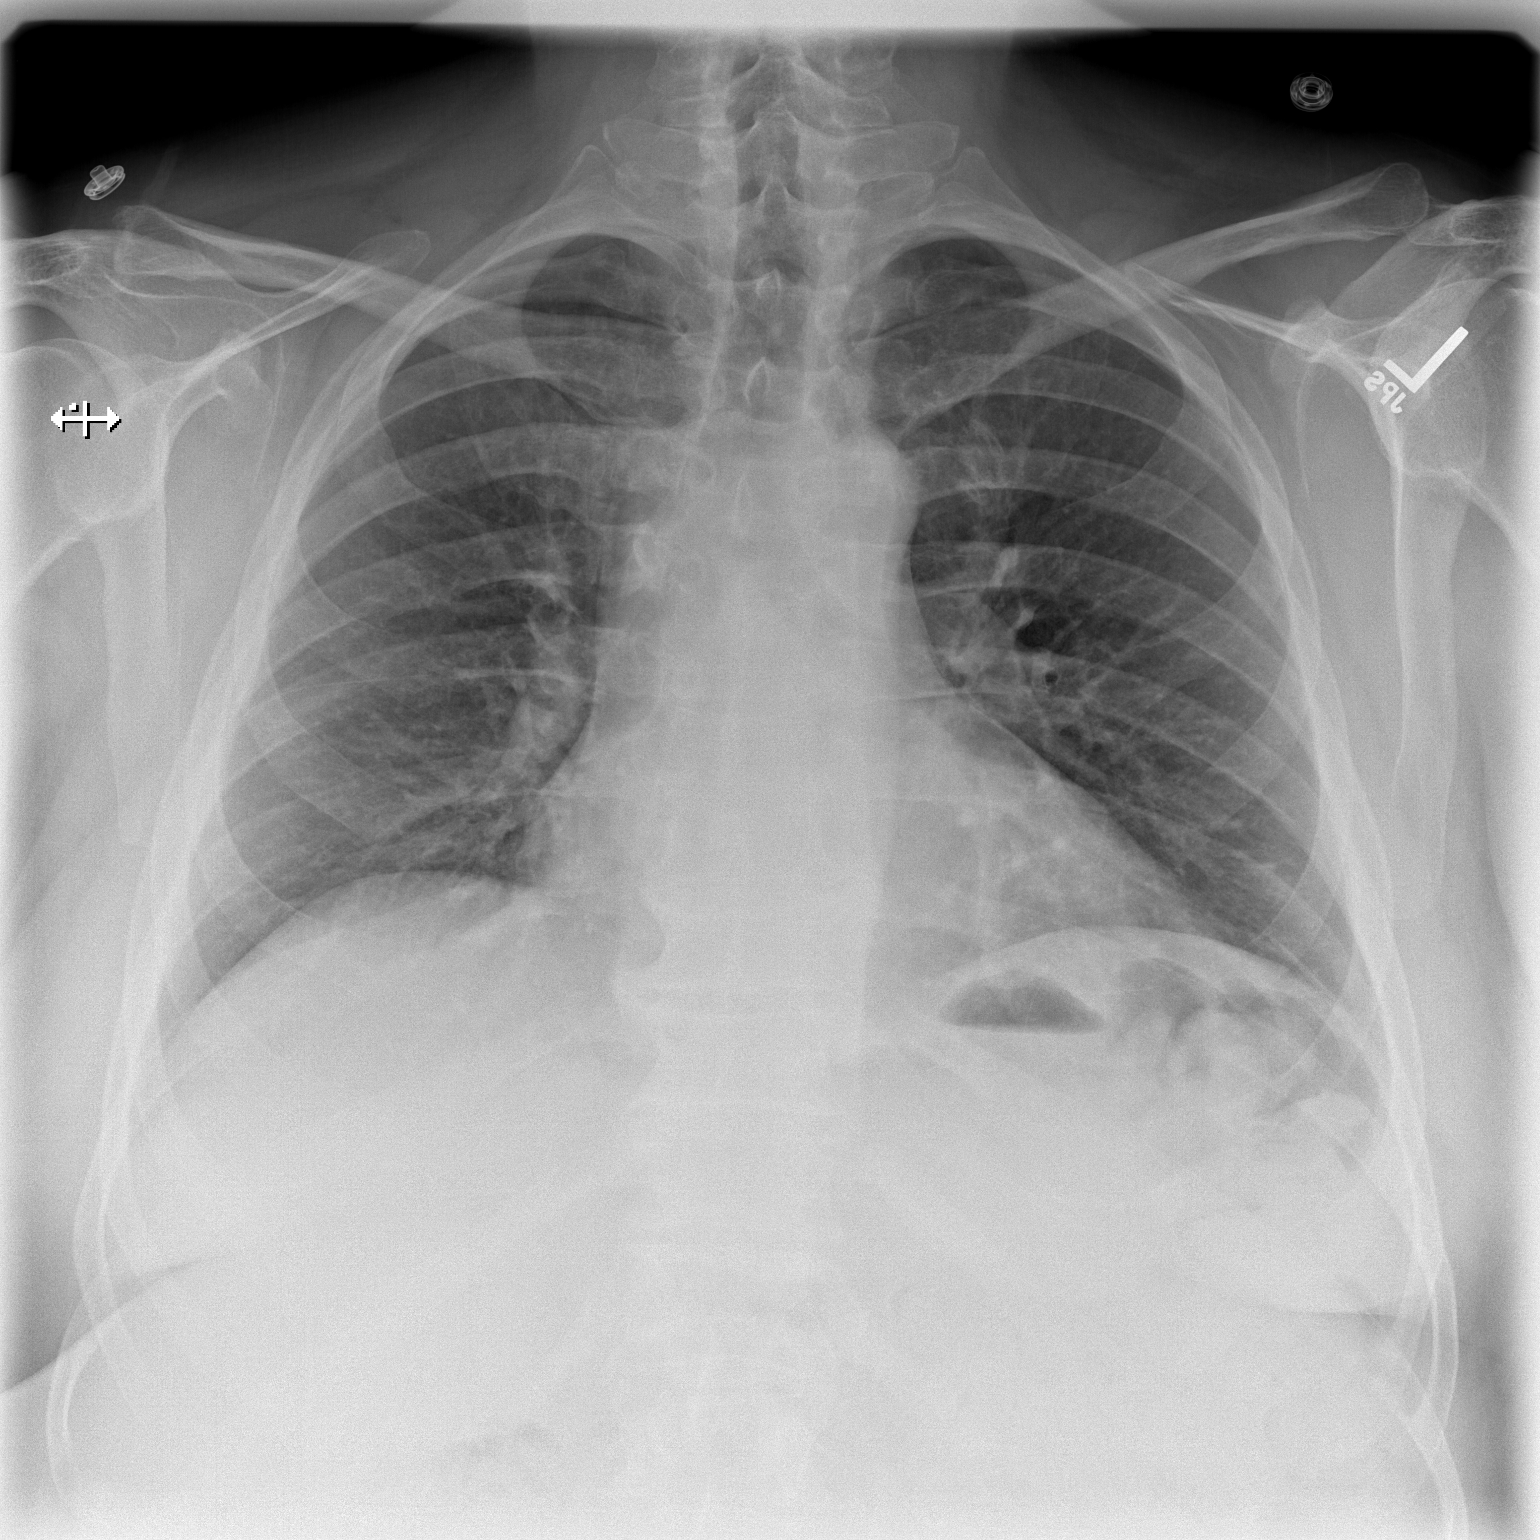

[2 of 2 positions shown; findings below may reference images not displayed]

FINDINGS: Shallow inspiration. The heart size and mediastinal contours are
within normal limits. Both lungs are clear. The visualized skeletal
structures are unremarkable. Degenerative changes in the spine.
IMPRESSION: No active cardiopulmonary disease.

## 2015-01-21 DIAGNOSIS — K3580 Unspecified acute appendicitis: Secondary | ICD-10-CM | POA: Insufficient documentation

## 2015-01-21 DIAGNOSIS — D3A020 Benign carcinoid tumor of the appendix: Secondary | ICD-10-CM | POA: Insufficient documentation

## 2015-12-30 DIAGNOSIS — M7732 Calcaneal spur, left foot: Secondary | ICD-10-CM | POA: Insufficient documentation

## 2015-12-30 DIAGNOSIS — M722 Plantar fascial fibromatosis: Secondary | ICD-10-CM | POA: Insufficient documentation

## 2016-03-19 DIAGNOSIS — M1990 Unspecified osteoarthritis, unspecified site: Secondary | ICD-10-CM | POA: Insufficient documentation

## 2016-03-19 DIAGNOSIS — K219 Gastro-esophageal reflux disease without esophagitis: Secondary | ICD-10-CM | POA: Insufficient documentation

## 2016-03-19 DIAGNOSIS — M5136 Other intervertebral disc degeneration, lumbar region: Secondary | ICD-10-CM | POA: Insufficient documentation

## 2016-03-19 DIAGNOSIS — G8929 Other chronic pain: Secondary | ICD-10-CM | POA: Insufficient documentation

## 2016-03-19 DIAGNOSIS — I10 Essential (primary) hypertension: Secondary | ICD-10-CM | POA: Insufficient documentation

## 2016-03-19 DIAGNOSIS — F419 Anxiety disorder, unspecified: Secondary | ICD-10-CM | POA: Insufficient documentation

## 2016-03-19 DIAGNOSIS — M549 Dorsalgia, unspecified: Secondary | ICD-10-CM

## 2016-03-19 DIAGNOSIS — E559 Vitamin D deficiency, unspecified: Secondary | ICD-10-CM | POA: Insufficient documentation

## 2016-06-18 DIAGNOSIS — F32A Depression, unspecified: Secondary | ICD-10-CM | POA: Insufficient documentation

## 2016-06-18 DIAGNOSIS — F329 Major depressive disorder, single episode, unspecified: Secondary | ICD-10-CM | POA: Insufficient documentation

## 2016-08-29 DIAGNOSIS — E119 Type 2 diabetes mellitus without complications: Secondary | ICD-10-CM | POA: Insufficient documentation

## 2016-11-14 DIAGNOSIS — K828 Other specified diseases of gallbladder: Secondary | ICD-10-CM | POA: Insufficient documentation

## 2017-11-12 DIAGNOSIS — F1120 Opioid dependence, uncomplicated: Secondary | ICD-10-CM | POA: Insufficient documentation

## 2017-12-11 LAB — PULMONARY FUNCTION TEST

## 2017-12-19 ENCOUNTER — Other Ambulatory Visit: Payer: Self-pay

## 2017-12-19 ENCOUNTER — Institutional Professional Consult (permissible substitution): Payer: Managed Care, Other (non HMO) | Admitting: Cardiothoracic Surgery

## 2017-12-19 ENCOUNTER — Encounter (HOSPITAL_COMMUNITY): Payer: Self-pay | Admitting: *Deleted

## 2017-12-19 ENCOUNTER — Other Ambulatory Visit: Payer: Self-pay | Admitting: *Deleted

## 2017-12-19 VITALS — BP 140/90 | HR 100 | Resp 20 | Ht 73.0 in | Wt 302.0 lb

## 2017-12-19 DIAGNOSIS — R590 Localized enlarged lymph nodes: Secondary | ICD-10-CM

## 2017-12-19 DIAGNOSIS — R569 Unspecified convulsions: Secondary | ICD-10-CM | POA: Insufficient documentation

## 2017-12-19 DIAGNOSIS — IMO0002 Reserved for concepts with insufficient information to code with codable children: Secondary | ICD-10-CM | POA: Insufficient documentation

## 2017-12-19 DIAGNOSIS — R079 Chest pain, unspecified: Secondary | ICD-10-CM | POA: Insufficient documentation

## 2017-12-19 NOTE — Progress Notes (Signed)
[address]       [place] [address]             [phone]                    Clinton Swearengin Derby Medical Record #[id number] Date of Birth: 1955/10/28  Referring: Daisy Lazar, MD Primary Care: Majel Homer, [place] Primary Cardiologist: No primary care provider on file.  Chief Complaint:    Chief Complaint  Patient presents with  . Adenopathy    Surgical eval, Chest CTA 10/10/17, EBUS 10/10/17, Chest CT 01/18/17 nd 07/19/16, spirometery 01/27/14    History of Present Illness:    Corey Griffith 62 y.o. male is seen in the office  today for evaluation for possible.  Patient is a 62 year old male who is had general decline in his health, with fatigue, shortness of breath, episodic night sweats.  He has had a evaluation in [place] CT scan of the chest CT scan demonstrated bilateral mediastinal hilar adenopathy.  Compared to a CT scan in November 2018 the mediastinal and hilar adenopathy has progressed he denies exposure to tuberculosis endobronchial ultrasound was performed in [place] pathology report from [facility] specimen number [id number] lymph node #7 no malignant cells were identified multiple histiocytes consistent with granulomatous formation the quick stain impression of granulomatous process such as sarcoidosis or infection was confirmed however there was inadequate material to perform stains were acid fast bacilli or fungi no granulomata were identified in the cell block material.  Patient is referred to obtain further diagnostic material to make a definitive diagnosis  Patient had been started on prednisone 20 mg a day for 7 days and then decreased to 10 mg a day which she is currently on.  In the office today the patient saturations on room air are 96% notes that at home at night he does use home oxygen.    Current Activity/ Functional Status:  Patient is independent with mobility/ambulation, transfers, ADL's,  IADL's.   Zubrod Score: At the time of surgery this patient's most appropriate activity status/level should be described as: []     0    Normal activity, no symptoms []     1    Restricted in physical strenuous activity but ambulatory, able to do out light work [x]     2    Ambulatory and capable of self care, unable to do work activities, up and about               >50 % of waking hours                              []     3    Only limited self care, in bed greater than 50% of waking hours []     4    Completely disabled, no self care, confined to bed or chair []     5    Moribund   Past Medical History:  Diagnosis Date  . Anxiety   . Arthritis    back, ankles  . Chronic kidney disease    high ,low, no meds  . GERD (gastroesophageal reflux disease)   . Hypertension   . Seizures     Past Surgical History:  Procedure Laterality Date  . BACK SURGERY  2013  . CARDIAC CATHETERIZATION      No family history on file.   Social History   Tobacco Use  Smoking Status Never Smoker  Smokeless Tobacco Never Used    Social History   Substance and Sexual Activity  Alcohol Use No     Allergies  Allergen Reactions  . Doxycycline Nausea And Vomiting  . Augmentin [Amoxicillin-Pot Clavulanate] Other (See Comments)    GI problems  . Gabapentin Other (See Comments)    Makes him go wild, not himself    Current Outpatient Medications  Medication Sig Dispense Refill  . busPIRone (BUSPAR) 10 MG tablet TAKE HALF TABLET BY MOUTH TWICE A DAY  3  . clonazePAM (KLONOPIN) 1 MG tablet Take 1 mg by mouth 2 (two) times daily as needed for anxiety.    . cyclobenzaprine (FLEXERIL) 10 MG tablet Take 10 mg by mouth 2 (two) times daily.     Marland Kitchen dexlansoprazole (DEXILANT) 60 MG capsule Take 60 mg by mouth daily.    . diazepam (VALIUM) 5 MG tablet Take 1-2 tablets (5-10 mg total) by mouth every 6 (six) hours as needed. 60 tablet 0  . DULoxetine (CYMBALTA) 60 MG capsule Take 60 mg by mouth 2 (two) times  daily.    Marland Kitchen JARDIANCE 25 MG TABS tablet Take 25 mg by mouth daily.  3  . nortriptyline (PAMELOR) 25 MG capsule Take by mouth daily.  3  . omeprazole (PRILOSEC) 40 MG capsule Take by mouth daily.  3  . OxyCODONE HCl ER (OXYCONTIN) 30 MG T12A Take 30 mg by mouth every 12 (twelve) hours.    . phenytoin (DILANTIN) 100 MG ER capsule Take 300-400 mg by mouth 2 (two) times daily. Take 4 capsules in the morning and 3 capsules in the evening    . predniSONE (DELTASONE) 10 MG tablet TAKE 2 TABLETS BY MOUTH EVERY DAY FOR 1 WEEK, THEN REDUCE TO 1 TABLET BY MOUTH EVERY DAY  1  . QUEtiapine (SEROQUEL) 100 MG tablet Take 100 mg by mouth daily as needed.  3   No current facility-administered medications for this visit.     Pertinent items are noted in HPI.   Review of Systems:     Cardiac Review of Systems: [Y] = yes  or   [ N ] = no   Chest Pain Florencio.Farrier    ]  Resting SOB [ y  ] Exertional SOB  Blue.Reese  ]  Orthopnea [ n ]   Pedal Edema [ y  ]    Palpitations [ n ] Syncope  [ n ]   Presyncope [n   ]   General Review of Systems: [Y] = yes [  ]=no Constitional: recent weight change [  ];  Wt loss over the last 3 months [   ] anorexia [  ]; fatigue [  ]; nausea [  ]; night sweats [  ]; fever [  ]; or chills [  ];           Eye : blurred vision [  ]; diplopia [   ]; vision changes [  ];  Amaurosis fugax[  ]; Resp: cough [  ];  wheezing[  ];  hemoptysis[  ]; shortness of breath[  ]; paroxysmal nocturnal dyspnea[  ]; dyspnea on exertion[  ]; or orthopnea[  ];  GI:  gallstones[  ], vomiting[  ];  dysphagia[  ]; melena[  ];  hematochezia [  ]; heartburn[  ];   Hx of  Colonoscopy[  ]; GU: kidney stones [  ]; hematuria[  ];   dysuria [  ];  nocturia[  ];  history of     obstruction [  ]; urinary frequency [  ]             Skin: rash, swelling[  ];, hair loss[  ];  peripheral edema[  ];  or itching[  ]; Musculosketetal: myalgias[  ];  joint swelling[  ];  joint erythema[  ];  joint pain[  ];  back pain[  ];  Heme/Lymph:  bruising[  ];  bleeding[  ];  anemia[  ];  Neuro: TIA[  ];  headaches[  ];  stroke[  ];  vertigo[  ];  seizures[  ];   paresthesias[  ];  difficulty walking[  ];  Psych:depression[  ]; anxiety[  ];  Endocrine: diabetes[  ];  thyroid dysfunction[  ];  Immunizations: Flu up to date [  ]; Pneumococcal up to date [  ];  Other: Patient's review of systems is positive for decreased energy weight loss shortness of breath with exertion home oxygen usage dry cough wheezing abdominal pain constipation reflux frequent heartburn frequent urination pain in his legs with walking pain in his feet history of seizures headaches arthritis difficulty walking anxiety depression nervousness loose and painful teeth easy bruising patient notes he is diabetic   PHYSICAL EXAMINATION: BP 140/90   Pulse 100   Resp 20   Ht 6\' 1"  (1.854 m)   Wt (!) 302 lb (137 kg)   SpO2 96% Comment: RA  BMI 39.84 kg/m  General appearance: alert, cooperative and appears older than stated age Head: Normocephalic, without obvious abnormality, atraumatic Neck: no adenopathy, no carotid bruit, no JVD, supple, symmetrical, trachea midline and thyroid not enlarged, symmetric, no tenderness/mass/nodules Lymph nodes: Cervical, supraclavicular, and axillary nodes normal. Resp: diminished breath sounds bibasilar Back: symmetric, no curvature. ROM normal. No CVA tenderness. Cardio: regular rate and rhythm, S1, S2 normal, no murmur, click, rub or gallop GI: soft, non-tender; bowel sounds normal; no masses,  no organomegaly Extremities: extremities normal, atraumatic, no cyanosis or edema and Homans sign is negative, no sign of DVT Neurologic: Grossly normal  Diagnostic Studies & Laboratory data:     Recent Radiology Findings:  See CT scan results and PACS system Evidence of bilateral hilar adenopathy   I have independently reviewed the above radiology studies  and reviewed the findings with the patient.   Recent Lab Findings: Lab  Results  Component Value Date   WBC 8.2 10/29/2012   HGB 14.4 10/29/2012   HCT 41.7 10/29/2012   PLT 231 10/29/2012   GLUCOSE 95 10/29/2012   NA 139 10/29/2012   K 4.7 10/29/2012   CL 104 10/29/2012   CREATININE 1.10 10/29/2012   BUN 10 10/29/2012   CO2 28 10/29/2012      Assessment / Plan:   Patient with significant systemic symptoms of weight loss fatigue shortness of breath night sweats in conjunction with bilateral hilar adenopathy, with radiographically consistent with sarcoidosis.  Previous enb is suggestive of granulomatous inflammation but no granuloma were noted, patient is referred for evaluation by thoracic surgery to obtain a sufficient material to make a definitive diagnosis.  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  Due to his distance from [place] we will plan to proceed with bronchoscopy ebus probable mediastinoscopy tomorrow, keep the patient for 24-hour observations postop.  Risks of procedures particularly risks of mediastinoscopy were discussed with the patient in detail.  The risk of anesthetic  complications risk of death infection bleeding blood transfusion pneumothorax were discussed.      I  spent 60 minutes with  the patient face to face and greater then 50% of the time was spent in counseling and coordination of care.    Grace Isaac MD      [address] [place] [address] Office [phone]   Beeper [phone]  12/19/2017 11:14 AM

## 2017-12-19 NOTE — Progress Notes (Signed)
   12/19/17 1523  OBSTRUCTIVE SLEEP APNEA  Have you ever been diagnosed with sleep apnea through a sleep study? No  Do you snore loudly (loud enough to be heard through closed doors)?  0  Do you often feel tired, fatigued, or sleepy during the daytime (such as falling asleep during driving or talking to someone)? 0  Has anyone observed you stop breathing during your sleep? 0  Do you have, or are you being treated for high blood pressure? 1  BMI more than 35 kg/m2? 1  Age > 50 (1-yes) 1  Neck circumference greater than:Male 16 inches or larger, Male 17inches or larger?  (assess dos)  Male Gender (Yes=1) 1  Obstructive Sleep Apnea Score 4

## 2017-12-19 NOTE — Progress Notes (Signed)
Pt denies any acute cardiopulmonary issues. Pt under the care of Dr. Sharyon Cable, Cardiology. Pt stated that a stress test, echo and cardiac cath were all preformed; records requested. Pt requested that Spouse complete assessment. Spouse stated that pt  " has not had a seizure since 1973. "  Spouse made aware to have pt stop taking vitamins, fish oil and herbal medications. Do not take any NSAIDs ie: Ibuprofen, Advil, Naproxen (Aleve), Motrin, Mobic, Fiorinal, BC and Goody Powder. Spouse made aware to tell pt DO NOT take Jardiance DOS. Spouse  made aware to have pt check BG every 2 hours prior to arrival to hospital on DOS. Spouse made aware to have pt treat a BG < 70 with  4 ounces of apple or cranberry juice, wait 15 minutes after intervention to recheck BG, if BG remains < 70, call Short Stay unit to speak with a nurse. Spouse verbalized understanding of all pre-op instructions. Anesthesia asked to review pt history.

## 2017-12-20 ENCOUNTER — Ambulatory Visit (HOSPITAL_COMMUNITY)
Admission: RE | Admit: 2017-12-20 | Discharge: 2017-12-20 | Disposition: A | Discharge status: Home / Self Care | Payer: Medicare HMO | Source: Ambulatory Visit | Attending: Cardiothoracic Surgery | Admitting: Cardiothoracic Surgery

## 2017-12-20 ENCOUNTER — Ambulatory Visit (HOSPITAL_COMMUNITY): Payer: Medicare HMO

## 2017-12-20 ENCOUNTER — Ambulatory Visit (HOSPITAL_COMMUNITY): Payer: Medicare HMO | Admitting: Physician Assistant

## 2017-12-20 ENCOUNTER — Encounter (HOSPITAL_COMMUNITY)
Admission: RE | Disposition: A | Discharge status: Home / Self Care | Payer: Self-pay | Source: Ambulatory Visit | Attending: Cardiothoracic Surgery

## 2017-12-20 ENCOUNTER — Encounter (HOSPITAL_COMMUNITY): Payer: Self-pay | Admitting: *Deleted

## 2017-12-20 DIAGNOSIS — I898 Other specified noninfective disorders of lymphatic vessels and lymph nodes: Secondary | ICD-10-CM | POA: Diagnosis not present

## 2017-12-20 DIAGNOSIS — M1990 Unspecified osteoarthritis, unspecified site: Secondary | ICD-10-CM | POA: Insufficient documentation

## 2017-12-20 DIAGNOSIS — Z888 Allergy status to other drugs, medicaments and biological substances status: Secondary | ICD-10-CM | POA: Diagnosis not present

## 2017-12-20 DIAGNOSIS — I129 Hypertensive chronic kidney disease with stage 1 through stage 4 chronic kidney disease, or unspecified chronic kidney disease: Secondary | ICD-10-CM | POA: Diagnosis not present

## 2017-12-20 DIAGNOSIS — R7303 Prediabetes: Secondary | ICD-10-CM | POA: Insufficient documentation

## 2017-12-20 DIAGNOSIS — Z880 Allergy status to penicillin: Secondary | ICD-10-CM | POA: Insufficient documentation

## 2017-12-20 DIAGNOSIS — F329 Major depressive disorder, single episode, unspecified: Secondary | ICD-10-CM | POA: Diagnosis not present

## 2017-12-20 DIAGNOSIS — K219 Gastro-esophageal reflux disease without esophagitis: Secondary | ICD-10-CM | POA: Insufficient documentation

## 2017-12-20 DIAGNOSIS — Z9981 Dependence on supplemental oxygen: Secondary | ICD-10-CM | POA: Insufficient documentation

## 2017-12-20 DIAGNOSIS — Z881 Allergy status to other antibiotic agents status: Secondary | ICD-10-CM | POA: Insufficient documentation

## 2017-12-20 DIAGNOSIS — N189 Chronic kidney disease, unspecified: Secondary | ICD-10-CM | POA: Insufficient documentation

## 2017-12-20 DIAGNOSIS — Z09 Encounter for follow-up examination after completed treatment for conditions other than malignant neoplasm: Secondary | ICD-10-CM

## 2017-12-20 DIAGNOSIS — R590 Localized enlarged lymph nodes: Secondary | ICD-10-CM

## 2017-12-20 HISTORY — DX: Other complications of anesthesia, initial encounter: T88.59XA

## 2017-12-20 HISTORY — DX: Headache, unspecified: R51.9

## 2017-12-20 HISTORY — PX: VIDEO BRONCHOSCOPY WITH ENDOBRONCHIAL ULTRASOUND: SHX6177

## 2017-12-20 HISTORY — DX: Adverse effect of unspecified anesthetic, initial encounter: T41.45XA

## 2017-12-20 HISTORY — DX: Pneumonia, unspecified organism: J18.9

## 2017-12-20 HISTORY — DX: Prediabetes: R73.03

## 2017-12-20 HISTORY — DX: Major depressive disorder, single episode, unspecified: F32.9

## 2017-12-20 HISTORY — DX: Depression, unspecified: F32.A

## 2017-12-20 HISTORY — DX: Localized enlarged lymph nodes: R59.0

## 2017-12-20 HISTORY — DX: Headache: R51

## 2017-12-20 HISTORY — DX: Dependence on supplemental oxygen: Z99.81

## 2017-12-20 LAB — COMPREHENSIVE METABOLIC PANEL
ALT: 30 U/L (ref 0–44)
AST: 25 U/L (ref 15–41)
Albumin: 4 g/dL (ref 3.5–5.0)
Alkaline Phosphatase: 94 U/L (ref 38–126)
Anion gap: 9 (ref 5–15)
BUN: 18 mg/dL (ref 6–20)
CO2: 24 mmol/L (ref 22–32)
Calcium: 9.7 mg/dL (ref 8.9–10.3)
Chloride: 105 mmol/L (ref 98–111)
Creatinine, Ser: 1.35 mg/dL — ABNORMAL HIGH (ref 0.61–1.24)
GFR calc Af Amer: >60 mL/min (ref >60)
GFR calc non Af Amer: 56 mL/min — ABNORMAL LOW (ref >60)
Glucose, Bld: 168 mg/dL — ABNORMAL HIGH (ref 70–99)
Potassium: 4.1 mmol/L (ref 3.5–5.1)
Sodium: 138 mmol/L (ref 135–145)
Total Bilirubin: 0.7 mg/dL (ref 0.3–1.2)
Total Protein: 8.1 g/dL (ref 6.5–8.1)

## 2017-12-20 LAB — CBC
HCT: 50.4 % (ref 39.0–52.0)
Hemoglobin: 15.4 g/dL (ref 13.0–17.0)
MCH: 30.1 pg (ref 26.0–34.0)
MCHC: 30.6 g/dL (ref 30.0–36.0)
MCV: 98.6 fL (ref 80.0–100.0)
Platelets: 249 10*3/uL (ref 150–400)
RBC: 5.11 MIL/uL (ref 4.22–5.81)
RDW: 14.4 % (ref 11.5–15.5)
WBC: 10.1 10*3/uL (ref 4.0–10.5)
nRBC: 0 % (ref 0.0–0.2)

## 2017-12-20 LAB — TYPE AND SCREEN
ABO/RH(D): O POS
Antibody Screen: NEGATIVE

## 2017-12-20 LAB — GLUCOSE, CAPILLARY
Glucose-Capillary: 158 mg/dL — ABNORMAL HIGH (ref 70–99)
Glucose-Capillary: 99 mg/dL (ref 70–99)

## 2017-12-20 LAB — PROTIME-INR
INR: 1
Prothrombin Time: 13.1 seconds (ref 11.4–15.2)

## 2017-12-20 LAB — HEMOGLOBIN A1C
Hgb A1c MFr Bld: 6.1 % — ABNORMAL HIGH (ref 4.8–5.6)
Mean Plasma Glucose: 128.37 mg/dL

## 2017-12-20 LAB — SURGICAL PCR SCREEN
MRSA, PCR: NEGATIVE
Staphylococcus aureus: NEGATIVE

## 2017-12-20 LAB — APTT: aPTT: 32 seconds (ref 24–36)

## 2017-12-20 SURGERY — BRONCHOSCOPY, WITH EBUS
Anesthesia: General

## 2017-12-20 MED ORDER — SUGAMMADEX SODIUM 500 MG/5ML IV SOLN
INTRAVENOUS | Status: AC
  Filled 2017-12-20: qty 5

## 2017-12-20 MED ORDER — DEXAMETHASONE SODIUM PHOSPHATE 10 MG/ML IJ SOLN
INTRAMUSCULAR | Status: DC | PRN
  Administered 2017-12-20: 8 mg via INTRAVENOUS

## 2017-12-20 MED ORDER — PROPOFOL 10 MG/ML IV BOLUS
INTRAVENOUS | Status: AC
  Filled 2017-12-20: qty 20

## 2017-12-20 MED ORDER — PHENYLEPHRINE 40 MCG/ML (10ML) SYRINGE FOR IV PUSH (FOR BLOOD PRESSURE SUPPORT)
PREFILLED_SYRINGE | INTRAVENOUS | Status: AC
  Filled 2017-12-20: qty 10

## 2017-12-20 MED ORDER — ROCURONIUM BROMIDE 50 MG/5ML IV SOSY
PREFILLED_SYRINGE | INTRAVENOUS | Status: DC | PRN
  Administered 2017-12-20: 50 mg via INTRAVENOUS
  Administered 2017-12-20 (×2): 10 mg via INTRAVENOUS

## 2017-12-20 MED ORDER — ESMOLOL HCL 100 MG/10ML IV SOLN
INTRAVENOUS | Status: AC
  Filled 2017-12-20: qty 10

## 2017-12-20 MED ORDER — PROPOFOL 10 MG/ML IV BOLUS
INTRAVENOUS | Status: DC | PRN
  Administered 2017-12-20: 200 mg via INTRAVENOUS

## 2017-12-20 MED ORDER — EPINEPHRINE PF 1 MG/ML IJ SOLN
INTRAMUSCULAR | Status: AC
  Filled 2017-12-20: qty 1

## 2017-12-20 MED ORDER — HYDROMORPHONE HCL 1 MG/ML IJ SOLN: 0.2500 mg | INTRAMUSCULAR | Status: DC | PRN

## 2017-12-20 MED ORDER — LACTATED RINGERS IV SOLN
INTRAVENOUS | Status: DC
  Administered 2017-12-20: 09:00:00 via INTRAVENOUS

## 2017-12-20 MED ORDER — MUPIROCIN 2 % EX OINT
1.0000 "application " | TOPICAL_OINTMENT | Freq: Once | CUTANEOUS | Status: AC
  Administered 2017-12-20: 1 via TOPICAL
  Filled 2017-12-20: qty 22

## 2017-12-20 MED ORDER — FENTANYL CITRATE (PF) 250 MCG/5ML IJ SOLN
INTRAMUSCULAR | Status: AC
  Filled 2017-12-20: qty 5

## 2017-12-20 MED ORDER — ROCURONIUM BROMIDE 50 MG/5ML IV SOSY
PREFILLED_SYRINGE | INTRAVENOUS | Status: AC
  Filled 2017-12-20: qty 10

## 2017-12-20 MED ORDER — PROMETHAZINE HCL 25 MG/ML IJ SOLN: 6.2500 mg | INTRAMUSCULAR | Status: DC | PRN

## 2017-12-20 MED ORDER — FENTANYL CITRATE (PF) 100 MCG/2ML IJ SOLN
INTRAMUSCULAR | Status: DC | PRN
  Administered 2017-12-20: 100 ug via INTRAVENOUS

## 2017-12-20 MED ORDER — OXYCODONE HCL 5 MG PO TABS: 5.0000 mg | ORAL_TABLET | Freq: Once | ORAL | Status: DC | PRN

## 2017-12-20 MED ORDER — ONDANSETRON HCL 4 MG/2ML IJ SOLN
INTRAMUSCULAR | Status: DC | PRN
  Administered 2017-12-20: 4 mg via INTRAVENOUS

## 2017-12-20 MED ORDER — ONDANSETRON HCL 4 MG/2ML IJ SOLN
INTRAMUSCULAR | Status: AC
  Filled 2017-12-20: qty 2

## 2017-12-20 MED ORDER — SUGAMMADEX SODIUM 500 MG/5ML IV SOLN
INTRAVENOUS | Status: DC | PRN
  Administered 2017-12-20: 500 mg via INTRAVENOUS

## 2017-12-20 MED ORDER — MIDAZOLAM HCL 2 MG/2ML IJ SOLN
INTRAMUSCULAR | Status: AC
  Filled 2017-12-20: qty 2

## 2017-12-20 MED ORDER — OXYCODONE HCL 5 MG/5ML PO SOLN: 5.0000 mg | Freq: Once | ORAL | Status: DC | PRN

## 2017-12-20 MED ORDER — 0.9 % SODIUM CHLORIDE (POUR BTL) OPTIME
TOPICAL | Status: DC | PRN
  Administered 2017-12-20: 1000 mL

## 2017-12-20 MED ORDER — PROPOFOL 500 MG/50ML IV EMUL
INTRAVENOUS | Status: DC | PRN
  Administered 2017-12-20: 50 ug/kg/min via INTRAVENOUS

## 2017-12-20 MED ORDER — SODIUM CHLORIDE 0.9 % IV SOLN
INTRAVENOUS | Status: DC | PRN
  Administered 2017-12-20: 20 ug/min via INTRAVENOUS

## 2017-12-20 MED ORDER — VANCOMYCIN HCL 10 G IV SOLR
1500.0000 mg | INTRAVENOUS | Status: AC
  Administered 2017-12-20: 1500 mg via INTRAVENOUS
  Filled 2017-12-20: qty 1500

## 2017-12-20 MED ORDER — ROCURONIUM BROMIDE 50 MG/5ML IV SOSY
PREFILLED_SYRINGE | INTRAVENOUS | Status: AC
  Filled 2017-12-20: qty 5

## 2017-12-20 MED ORDER — MIDAZOLAM HCL 5 MG/5ML IJ SOLN
INTRAMUSCULAR | Status: DC | PRN
  Administered 2017-12-20: 2 mg via INTRAVENOUS

## 2017-12-20 MED ORDER — LIDOCAINE HCL 1 % IJ SOLN
INTRAMUSCULAR | Status: DC | PRN
  Administered 2017-12-20: 1 mL

## 2017-12-20 SURGICAL SUPPLY — 57 items
ADAPTER VALVE BIOPSY EBUS (MISCELLANEOUS) IMPLANT
ADPTR VALVE BIOPSY EBUS (MISCELLANEOUS)
BLADE SURG 10 STRL SS (BLADE) ×2 IMPLANT
BRUSH CYTOL CELLEBRITY 1.5X140 (MISCELLANEOUS) IMPLANT
CANISTER SUCT 3000ML PPV (MISCELLANEOUS) ×4 IMPLANT
CLIP VESOCCLUDE MED 6/CT (CLIP) IMPLANT
CONT SPEC 4OZ CLIKSEAL STRL BL (MISCELLANEOUS) ×6 IMPLANT
COVER BACK TABLE 60X90IN (DRAPES) ×2 IMPLANT
COVER DOME SNAP 22 D (MISCELLANEOUS) ×2 IMPLANT
COVER SURGICAL LIGHT HANDLE (MISCELLANEOUS) ×4 IMPLANT
COVER WAND RF STERILE (DRAPES) ×4 IMPLANT
DERMABOND ADVANCED (GAUZE/BANDAGES/DRESSINGS) ×1
DERMABOND ADVANCED .7 DNX12 (GAUZE/BANDAGES/DRESSINGS) ×1 IMPLANT
DRAPE LAPAROTOMY T 102X78X121 (DRAPES) ×2 IMPLANT
DRSG AQUACEL AG ADV 3.5X14 (GAUZE/BANDAGES/DRESSINGS) ×2 IMPLANT
ELECT CAUTERY BLADE 6.4 (BLADE) ×2 IMPLANT
ELECT REM PT RETURN 9FT ADLT (ELECTROSURGICAL) ×2
ELECTRODE REM PT RTRN 9FT ADLT (ELECTROSURGICAL) ×1 IMPLANT
FORCEPS BIOP RJ4 1.8 (CUTTING FORCEPS) IMPLANT
FORCEPS BIOP SPYBITE 1.2X286 (FORCEP) ×2 IMPLANT
GAUZE 4X4 16PLY RFD (DISPOSABLE) ×2 IMPLANT
GAUZE SPONGE 4X4 12PLY STRL (GAUZE/BANDAGES/DRESSINGS) ×4 IMPLANT
GLOVE BIO SURGEON STRL SZ 6.5 (GLOVE) ×4 IMPLANT
GOWN STRL REUS W/ TWL LRG LVL3 (GOWN DISPOSABLE) ×1 IMPLANT
GOWN STRL REUS W/TWL LRG LVL3 (GOWN DISPOSABLE) ×1
HEMOSTAT SURGICEL 2X14 (HEMOSTASIS) IMPLANT
KIT BASIN OR (CUSTOM PROCEDURE TRAY) ×2 IMPLANT
KIT CLEAN ENDO COMPLIANCE (KITS) ×6 IMPLANT
KIT TURNOVER KIT B (KITS) ×4 IMPLANT
MARKER SKIN DUAL TIP RULER LAB (MISCELLANEOUS) ×2 IMPLANT
NEEDLE ASPIRATION VIZISHOT 19G (NEEDLE) IMPLANT
NEEDLE ASPIRATION VIZISHOT 21G (NEEDLE) ×4 IMPLANT
NEEDLE FILTER BLUNT 18X 1/2SAF (NEEDLE)
NEEDLE FILTER BLUNT 18X1 1/2 (NEEDLE) IMPLANT
NS IRRIG 1000ML POUR BTL (IV SOLUTION) ×4 IMPLANT
OIL SILICONE PENTAX (PARTS (SERVICE/REPAIRS)) ×2 IMPLANT
PACK SURGICAL SETUP 50X90 (CUSTOM PROCEDURE TRAY) ×2 IMPLANT
PAD ARMBOARD 7.5X6 YLW CONV (MISCELLANEOUS) ×8 IMPLANT
PENCIL BUTTON HOLSTER BLD 10FT (ELECTRODE) ×2 IMPLANT
SPONGE INTESTINAL PEANUT (DISPOSABLE) IMPLANT
STAPLER VISISTAT 35W (STAPLE) IMPLANT
SUT VIC AB 3-0 SH 18 (SUTURE) ×2 IMPLANT
SUT VICRYL 4-0 PS2 18IN ABS (SUTURE) ×2 IMPLANT
SWAB COLLECTION DEVICE MRSA (MISCELLANEOUS) IMPLANT
SWAB CULTURE ESWAB REG 1ML (MISCELLANEOUS) IMPLANT
SYR 10ML LL (SYRINGE) ×2 IMPLANT
SYR 20CC LL (SYRINGE) ×2 IMPLANT
SYR 20ML ECCENTRIC (SYRINGE) ×2 IMPLANT
TOWEL GREEN STERILE (TOWEL DISPOSABLE) ×4 IMPLANT
TOWEL GREEN STERILE FF (TOWEL DISPOSABLE) ×4 IMPLANT
TRAP SPECIMEN MUCOUS 40CC (MISCELLANEOUS) ×2 IMPLANT
TUBE CONNECTING 12X1/4 (SUCTIONS) ×2 IMPLANT
TUBE CONNECTING 20X1/4 (TUBING) ×2 IMPLANT
VALVE BIOPSY  SINGLE USE (MISCELLANEOUS) ×1
VALVE BIOPSY SINGLE USE (MISCELLANEOUS) ×1 IMPLANT
VALVE SUCTION BRONCHIO DISP (MISCELLANEOUS) ×2 IMPLANT
WATER STERILE IRR 1000ML POUR (IV SOLUTION) ×4 IMPLANT

## 2017-12-20 NOTE — H&P (Signed)
[address]       [place], [address]             [phone]                    Corey Griffith Medical Record #[id number] Date of Birth: [birth year]  Referring: Daisy Lazar, MD Primary Care: Corey Griffith, [place] Primary Cardiologist: No primary care provider on file.  Chief Complaint:    Chief Complaint  Patient presents with  . Adenopathy    Surgical eval, Chest CTA 10/10/17, EBUS 10/10/17, Chest CT 01/18/17 nd 07/19/16, spirometery 01/27/14      History of Present Illness:    Corey Griffith 62 y.o. male is seen in the office  today for evaluation for possible.  Patient is a 62 year old male who is had general decline in his health, with fatigue, shortness of breath, episodic night sweats.  He has had a evaluation in [place] CT scan of the chest CT scan demonstrated bilateral mediastinal hilar adenopathy.  Compared to a CT scan in November 2018 the mediastinal and hilar adenopathy has progressed he denies exposure to tuberculosis endobronchial ultrasound was performed in [place] pathology report from [facility] specimen number [id number] lymph node #7 no malignant cells were identified multiple histiocytes consistent with granulomatous formation the quick stain impression of granulomatous process such as sarcoidosis or infection was confirmed however there was inadequate material to perform stains were acid fast bacilli or fungi no granulomata were identified in the cell block material.  Patient is referred to obtain further diagnostic material to make a definitive diagnosis  Patient had been started on prednisone 20 mg a day for 7 days and then decreased to 10 mg a day which she is currently on.  In the office today the patient saturations on room air are 96% notes that at home at night he does use home oxygen.    Current Activity/ Functional Status:  Patient is independent with mobility/ambulation, transfers, ADL's,  IADL's.   Zubrod Score: At the time of surgery this patient's most appropriate activity status/level should be described as: []     0    Normal activity, no symptoms []     1    Restricted in physical strenuous activity but ambulatory, able to do out light work [x]     2    Ambulatory and capable of self care, unable to do work activities, up and about               >50 % of waking hours                              []     3    Only limited self care, in bed greater than 50% of waking hours []     4    Completely disabled, no self care, confined to bed or chair []     5    Moribund   Past Medical History:  Diagnosis Date  . Anxiety   . Arthritis    back, ankles  . Chronic kidney disease    high ,low, no meds  . Complication of anesthesia    low oxygen saturation and PMH of aspiration during upper GI procedure   . Depression   . GERD (gastroesophageal reflux disease)   . Headache   . Hypertension   . Mediastinal lymphadenopathy   . Pneumonia   . Pre-diabetes   .  Requires supplemental oxygen    when lying down  . Seizures ([place])     Past Surgical History:  Procedure Laterality Date  . APPENDECTOMY    . BACK SURGERY  2013  . CARDIAC CATHETERIZATION    . CHOLECYSTECTOMY      Family History  Problem Relation Age of Onset  . Hypertension Mother   . Dementia Mother   . Hypercholesterolemia Mother   . Hypertension Father   . Diabetes Father   . Heart attack Father      Social History   Tobacco Use  Smoking Status Never Smoker  Smokeless Tobacco Never Used    Social History   Substance and Sexual Activity  Alcohol Use No     Allergies  Allergen Reactions  . Gabapentin Other (See Comments)    Makes him go wild, not himself  . Augmentin [Amoxicillin-Pot Clavulanate] Other (See Comments)    GI problems  . Doxycycline Nausea And Vomiting    Current Facility-Administered Medications  Medication Dose Route Frequency Provider Last Rate Last Dose  . lactated  ringers infusion   Intravenous Continuous Ellender, Karyl Kinnier, MD 50 mL/hr at 12/20/17 0902    . vancomycin (VANCOCIN) 1,500 mg in sodium chloride 0.9 % 500 mL IVPB  1,500 mg Intravenous To SS-Surg Grace Isaac, MD        Pertinent items are noted in HPI.   Review of Systems:     Cardiac Review of Systems: [Y] = yes  or   [ N ] = no   Chest Pain Florencio.Farrier    ]  Resting SOB [ y  ] Exertional SOB  Blue.Reese  ]  Orthopnea [ n ]   Pedal Edema [ y  ]    Palpitations [ n ] Syncope  [ n ]   Presyncope [n   ]   General Review of Systems: [Y] = yes [  ]=no Constitional: recent weight change [  ];  Wt loss over the last 3 months [   ] anorexia [  ]; fatigue [  ]; nausea [  ]; night sweats [  ]; fever [  ]; or chills [  ];           Eye : blurred vision [  ]; diplopia [   ]; vision changes [  ];  Amaurosis fugax[  ]; Resp: cough [  ];  wheezing[  ];  hemoptysis[  ]; shortness of breath[  ]; paroxysmal nocturnal dyspnea[  ]; dyspnea on exertion[  ]; or orthopnea[  ];  GI:  gallstones[  ], vomiting[  ];  dysphagia[  ]; melena[  ];  hematochezia [  ]; heartburn[  ];   Hx of  Colonoscopy[  ]; GU: kidney stones [  ]; hematuria[  ];   dysuria [  ];  nocturia[  ];  history of     obstruction [  ]; urinary frequency [  ]             Skin: rash, swelling[  ];, hair loss[  ];  peripheral edema[  ];  or itching[  ]; Musculosketetal: myalgias[  ];  joint swelling[  ];  joint erythema[  ];  joint pain[  ];  back pain[  ];  Heme/Lymph: bruising[  ];  bleeding[  ];  anemia[  ];  Neuro: TIA[  ];  headaches[  ];  stroke[  ];  vertigo[  ];  seizures[  ];   paresthesias[  ];  difficulty walking[  ];  Psych:depression[  ]; anxiety[  ];  Endocrine: diabetes[  ];  thyroid dysfunction[  ];  Immunizations: Flu up to date [  ]; Pneumococcal up to date [  ];  Other: Patient's review of systems is positive for decreased energy weight loss shortness of breath with exertion home oxygen usage dry cough wheezing abdominal pain constipation  reflux frequent heartburn frequent urination pain in his legs with walking pain in his feet history of seizures headaches arthritis difficulty walking anxiety depression nervousness loose and painful teeth easy bruising patient notes he is diabetic   PHYSICAL EXAMINATION: Pulse 79   Temp 98.4 F (36.9 C) (Oral)   Resp 20   Ht 6\' 2"  (1.88 m)   Wt (!) 137 kg   SpO2 95%   BMI 38.77 kg/m  General appearance: alert, cooperative and appears older than stated age Head: Normocephalic, without obvious abnormality, atraumatic Neck: no adenopathy, no carotid bruit, no JVD, supple, symmetrical, trachea midline and thyroid not enlarged, symmetric, no tenderness/mass/nodules Lymph nodes: Cervical, supraclavicular, and axillary nodes normal. Resp: diminished breath sounds bibasilar Back: symmetric, no curvature. ROM normal. No CVA tenderness. Cardio: regular rate and rhythm, S1, S2 normal, no murmur, click, rub or gallop GI: soft, non-tender; bowel sounds normal; no masses,  no organomegaly Extremities: extremities normal, atraumatic, no cyanosis or edema and Homans sign is negative, no sign of DVT Neurologic: Grossly normal  Diagnostic Studies & Laboratory data:     Recent Radiology Findings:  See CT scan results and PACS system Evidence of bilateral hilar adenopathy   I have independently reviewed the above radiology studies  and reviewed the findings with the patient.   Recent Lab Findings: Lab Results  Component Value Date   WBC 10.1 12/20/2017   HGB 15.4 12/20/2017   HCT 50.4 12/20/2017   PLT 249 12/20/2017   GLUCOSE 168 (H) 12/20/2017   ALT 30 12/20/2017   AST 25 12/20/2017   NA 138 12/20/2017   K 4.1 12/20/2017   CL 105 12/20/2017   CREATININE 1.35 (H) 12/20/2017   BUN 18 12/20/2017   CO2 24 12/20/2017   INR 1.00 12/20/2017   HGBA1C 6.1 (H) 12/20/2017      Assessment / Plan:   Patient with significant systemic symptoms of weight loss fatigue shortness of breath night  sweats in conjunction with bilateral hilar adenopathy, with radiographically consistent with sarcoidosis.  Previous enb is suggestive of granulomatous inflammation but no granuloma were noted, patient is referred for evaluation by thoracic surgery to obtain a sufficient material to make a definitive diagnosis.  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  Due to his distance from [place] we will plan to proceed with bronchoscopy ebus probable mediastinoscopy tomorrow, keep the patient for 24-hour observations postop.  Risks of procedures particularly risks of mediastinoscopy were discussed with the patient in detail.  The risk of anesthetic complications risk of death infection bleeding blood transfusion pneumothorax were discussed.   The goals risks and alternatives of the planned surgical procedure Procedure(s): VIDEO BRONCHOSCOPY WITH ENDOBRONCHIAL ULTRASOUND (N/A) POSSIBLE MEDIASTINOSCOPY (N/A)  have been discussed with the patient in detail. The risks of the procedure including death, infection, stroke, myocardial infarction, bleeding, blood transfusion have all been discussed specifically.  I have quoted [name] a 1 % of perioperative mortality and a complication rate as high as 10 %. The patient's questions have  been answered.Davonn Flanery is willing  to proceed with the planned procedure.    Grace Isaac MD      [address] [place] [address] Office [phone]   Beeper [phone]  12/20/2017 10:59 AM

## 2017-12-20 NOTE — Anesthesia Procedure Notes (Signed)
Procedure Name: Intubation Date/Time: 12/20/2017 11:39 AM Performed by: Inda Coke, CRNA Pre-anesthesia Checklist: Patient identified, Emergency Drugs available, Suction available and Patient being monitored Patient Re-evaluated:Patient Re-evaluated prior to induction Oxygen Delivery Method: Circle System Utilized Preoxygenation: Pre-oxygenation with 100% oxygen Induction Type: IV induction Ventilation: Mask ventilation without difficulty and Oral airway inserted - appropriate to patient size Laryngoscope Size: Mac and 4 Grade View: Grade II Tube type: Oral Tube size: 8.5 mm Number of attempts: 1 Airway Equipment and Method: Stylet and Oral airway Placement Confirmation: ETT inserted through vocal cords under direct vision,  positive ETCO2 and breath sounds checked- equal and bilateral Secured at: 22 cm Tube secured with: Tape Dental Injury: Teeth and Oropharynx as per pre-operative assessment

## 2017-12-20 NOTE — Transfer of Care (Signed)
Immediate Anesthesia Transfer of Care Note  Patient: Corey Griffith  Procedure(s) Performed: VIDEO BRONCHOSCOPY WITH ENDOBRONCHIAL ULTRASOUND (N/A )  Patient Location: PACU  Anesthesia Type:General  Level of Consciousness: awake, alert  and drowsy  Airway & Oxygen Therapy: Patient Spontanous Breathing and Patient connected to nasal cannula oxygen  Post-op Assessment: Report given to RN, Post -op Vital signs reviewed and stable and Patient moving all extremities X 4  Post vital signs: Reviewed and stable  Last Vitals:  Vitals Value Taken Time  BP 130/78 12/20/2017 12:46 PM  Temp 36.5 C 12/20/2017 12:47 PM  Pulse 88 12/20/2017 12:51 PM  Resp 20 12/20/2017 12:51 PM  SpO2 96 % 12/20/2017 12:51 PM  Vitals shown include unvalidated device data.  Last Pain:  Vitals:   12/20/17 1247  TempSrc:   PainSc: 0-No pain      Patients Stated Pain Goal: 5 (88/89/16 9450)  Complications: No apparent anesthesia complications

## 2017-12-20 NOTE — Brief Op Note (Signed)
      [address]       [place] [address]             [phone]      12/20/2017  1:00 PM  PATIENT:  Corey Griffith  62 y.o. male  PRE-OPERATIVE DIAGNOSIS:  MEDIASTINAL LYMPHADENOPATHY  POST-OPERATIVE DIAGNOSIS:  MEDIASTINAL LYMPHADENOPATHY, no malignancy, granulomatous inflammation , cultures pending    PROCEDURE:  Procedure(s): VIDEO BRONCHOSCOPY WITH ENDOBRONCHIAL ULTRASOUND (N/A)  SURGEON:  Surgeon(s) and Role:    * Grace Isaac, MD - Primary  PHYSICIAN ASSISTANT:   ASSISTANTS: none   ANESTHESIA:   none  EBL: none   BLOOD ADMINISTERED:none  DRAINS: none   LOCAL MEDICATIONS USED:  NONE  SPECIMEN:  Source of Specimen:  #7 Nodes   DISPOSITION OF SPECIMEN:  PATHOLOGY  COUNTS:  YES   DICTATION: .Dragon Dictation  PLAN OF CARE: Discharge to home after PACU  PATIENT DISPOSITION:  PACU - hemodynamically stable.   Delay start of Pharmacological VTE agent (>24hrs) due to surgical blood loss or risk of bleeding: yes

## 2017-12-20 NOTE — Anesthesia Preprocedure Evaluation (Addendum)
Anesthesia Evaluation  Patient identified by MRN, date of birth, ID band Patient awake    Reviewed: Allergy & Precautions, NPO status , Patient's Chart, lab work & pertinent test results  Airway Mallampati: III  TM Distance: >3 FB Neck ROM: Full    Dental  (+) Chipped,    Pulmonary neg pulmonary ROS,    Pulmonary exam normal breath sounds clear to auscultation       Cardiovascular hypertension, Pt. on medications Normal cardiovascular exam Rhythm:Regular Rate:Normal  ECG: rate 74. Normal sinus rhythm Left axis deviation Left ventricular hypertrophy with QRS widening   Neuro/Psych  Headaches, Seizures -, Well Controlled,  PSYCHIATRIC DISORDERS Anxiety Depression    GI/Hepatic Neg liver ROS, GERD  Medicated and Controlled,  Endo/Other  diabetes, Oral Hypoglycemic Agents  Renal/GU negative Renal ROS     Musculoskeletal Ambulates with cane   Abdominal (+) + obese,   Peds  Hematology negative hematology ROS (+)   Anesthesia Other Findings MEDIASTINAL LYMPHADENOPATHY  Reproductive/Obstetrics                            Anesthesia Physical Anesthesia Plan  ASA: III  Anesthesia Plan: General   Post-op Pain Management:    Induction: Intravenous  PONV Risk Score and Plan: 2 and Ondansetron, Dexamethasone, Midazolam and Treatment may vary due to age or medical condition  Airway Management Planned: Oral ETT  Additional Equipment: Arterial line  Intra-op Plan:   Post-operative Plan: Extubation in OR  Informed Consent: I have reviewed the patients History and Physical, chart, labs and discussed the procedure including the risks, benefits and alternatives for the proposed anesthesia with the patient or authorized representative who has indicated his/her understanding and acceptance.   Dental advisory given  Plan Discussed with: CRNA  Anesthesia Plan Comments:        Anesthesia Quick  Evaluation

## 2017-12-20 NOTE — Discharge Instructions (Signed)
Flexible Bronchoscopy, Care After °This sheet gives you information about how to care for yourself after your procedure. Your health care provider may also give you more specific instructions. If you have problems or questions, contact your health care provider. °What can I expect after the procedure? °After the procedure, it is common to have the following symptoms for 24-48 hours: °· A cough that is worse than it was before the procedure. °· A low-grade fever. °· A sore throat or hoarse voice. °· Small streaks of blood in the mucus from your lungs (sputum), if tissue samples were removed (biopsy). ° °Follow these instructions at home: °Eating and drinking °· Do not eat or drink anything (including water) for 2 hours after your procedure, or until your numbing medicine (local anesthetic) has worn off. Having a numb throat increases your risk of burning yourself or choking. °· After your numbness is gone and your cough and gag reflexes have returned, you may start eating only soft foods and slowly drinking liquids. °· The day after the procedure, return to your normal diet. °Driving °· Do not drive for 24 hours if you were given a medicine to help you relax (sedative). °· Do not drive or use heavy machinery while taking prescription pain medicine. °General instructions °· Take over-the-counter and prescription medicines only as told by your health care provider. °· Return to your normal activities as told by your health care provider. Ask your health care provider what activities are safe for you. °· Do not use any products that contain nicotine or tobacco, such as cigarettes and e-cigarettes. If you need help quitting, ask your health care provider. °· Keep all follow-up visits as told by your health care provider. This is important, especially if you had a biopsy taken. °Get help right away if: °· You have shortness of breath that gets worse. °· You become light-headed or feel like you might faint. °· You have  chest pain. °· You cough up more than a small amount of blood. °· The amount of blood you cough up increases. °Summary °· Common symptoms in the 24-48 hours following a flexible bronchoscopy include cough, low-grade fever, sore throat or hoarse voice, and blood-streaked mucus from the lungs (if you had a biopsy). °· Do not eat or drink anything (including water) for 2 hours after your procedure, or until your local anesthetic has worn off. You can return to your normal diet the day after the procedure. °· Get help right away if you develop worsening shortness of breath, have chest pain, become light-headed, or cough up more than a small amount of blood. °This information is not intended to replace advice given to you by your health care provider. Make sure you discuss any questions you have with your health care provider. °Document Released: 08/25/2004 Document Revised: 02/24/2016 Document Reviewed: 02/24/2016 °Elsevier Interactive Patient Education © 2017 [employer] ° °

## 2017-12-20 NOTE — Anesthesia Postprocedure Evaluation (Signed)
Anesthesia Post Note  Patient: [name]  Procedure(s) Performed: VIDEO BRONCHOSCOPY WITH ENDOBRONCHIAL ULTRASOUND (N/A )     Patient location during evaluation: PACU Anesthesia Type: General Level of consciousness: awake and alert Pain management: pain level controlled Vital Signs Assessment: post-procedure vital signs reviewed and stable Respiratory status: spontaneous breathing, nonlabored ventilation, respiratory function stable and patient connected to nasal cannula oxygen Cardiovascular status: blood pressure returned to baseline and stable Postop Assessment: no apparent nausea or vomiting Anesthetic complications: no    Last Vitals:  Vitals:   12/20/17 1415 12/20/17 1420  BP: 115/78 123/78  Pulse: 67 69  Resp: 12 13  Temp: (!) 36.1 C   SpO2: 98% 99%    Last Pain:  Vitals:   12/20/17 1247  TempSrc:   PainSc: 0-No pain                 Ryan P Ellender

## 2017-12-21 ENCOUNTER — Encounter (HOSPITAL_COMMUNITY): Payer: Self-pay | Admitting: Cardiothoracic Surgery

## 2017-12-21 LAB — ACID FAST SMEAR (AFB, MYCOBACTERIA): Acid Fast Smear: NEGATIVE

## 2017-12-23 NOTE — Op Note (Signed)
NAME: ABLE, MALLOY MEDICAL RECORD [id number] ACCOUNT [id number] DATE OF BIRTH:September 21, 1955 FACILITY: [facility] LOCATION: [facility] PHYSICIAN:Deliliah Spranger Maryruth Bun, MD  OPERATIVE REPORT  DATE OF PROCEDURE:  12/20/2017  PREOPERATIVE DIAGNOSIS:  Bilateral hilar adenopathy suggestive of granulomatous disease.  POSTOPERATIVE DIAGNOSIS:  Bilateral hilar adenopathy suggestive of granulomatous disease.  SURGICAL PROCEDURE:  Bronchoscopy with EBUS and multiple transbronchial biopsies.  SURGEON:  Lanelle Bal, MD  BRIEF HISTORY:  The patient is a 62 year old male who was being evaluated in [facility] for generalized fatigue, weakness, episodic night sweats and fever.  CT scan of the chest showed bilateral hilar and mediastinal adenopathy.  He was referred from  [facility] after EBUS because of concern of inadequate tissue for a diagnosis, particularly cultures.  The patient was seen and discussed with him proceeding with repeat EBUS and if insufficient material was obtained, then, proceeding with  mediastinoscopy.  Risks and options were discussed with the patient in detail.  He was agreeable and signed informed consent.  DESCRIPTION OF PROCEDURE:  The patient underwent general endotracheal anesthesia without incident.  Appropriate timeout was performed.  We then proceeded with visual bronchoscopy to the subsegmental level of both the right and left tracheobronchial tree  without evidence of endobronchial lesions.  The standard bronchoscope was then removed and an EBUS scope placed.  There was easily identifiable enlarged lymph nodes, both paratracheal and in the #7 region.  With a #21 EBUS needle, multiple biopsies were  obtained.  Initial smear in the operating room showed adequate diagnostic material.  On review by pathology.  This was most consistent with granulomatous inflammation.  Final path is pending.  With the general size nodes and desire to get additional  tissue.  A small micro  biopsy forceps was used in conjunction with the EBUS scope and passed through the previously created needle puncture sites and additional smaller fragments of lymph nodes were obtained and sent for permanent evaluation.  In  addition, tissue was sent for routine AFB and fungal cultures.  With this what appears to be adequate diagnostic material and without evidence of malignancy or lymphoma,  we did not proceed with mediastinoscopy, as it did not appear that it would add any  more diagnostic material than we already have obtained.  The patient's tracheobronchial tree was cleared of all secretions.    He was extubated in the operating room having tolerated the procedure without obvious complication and was transferred to the recovery room for postoperative care.  AN/NUANCE  D:12/22/2017 T:12/23/2017 JOB:[id number]

## 2017-12-25 LAB — ANAEROBIC CULTURE: Gram Stain: NONE SEEN

## 2017-12-26 ENCOUNTER — Telehealth: Payer: Self-pay

## 2017-12-26 ENCOUNTER — Encounter: Payer: Self-pay | Admitting: Cardiothoracic Surgery

## 2017-12-26 ENCOUNTER — Other Ambulatory Visit: Payer: Self-pay | Admitting: *Deleted

## 2017-12-26 ENCOUNTER — Ambulatory Visit (INDEPENDENT_AMBULATORY_CARE_PROVIDER_SITE_OTHER): Payer: Managed Care, Other (non HMO) | Admitting: Cardiothoracic Surgery

## 2017-12-26 VITALS — BP 143/98 | HR 87 | Resp 20 | Ht 74.0 in | Wt 302.0 lb

## 2017-12-26 DIAGNOSIS — R590 Localized enlarged lymph nodes: Secondary | ICD-10-CM

## 2017-12-26 NOTE — Progress Notes (Signed)
Results for orders placed or performed during the hospital encounter of 12/20/17  Surgical pcr screen     Status: None   Collection Time: 12/20/17  9:19 AM  Result Value Ref Range Status   MRSA, PCR NEGATIVE NEGATIVE Final   Staphylococcus aureus NEGATIVE NEGATIVE Final    Comment: (NOTE) The Xpert SA Assay (FDA approved for NASAL specimens in patients 60 years of age and older), is one component of a comprehensive surveillance program. It is not intended to diagnose infection nor to guide or monitor treatment. Performed at [facility], [place] [address] [place], [place] [address]   Anaerobic culture     Status: None   Collection Time: 12/20/17 12:00 PM  Result Value Ref Range Status   Specimen Description LYMPH NODE  Final   Special Requests NONE  Final   Gram Stain NO WBC SEEN NO ORGANISMS SEEN   Final   Culture   Final    RARE PROPIONIBACTERIUM ACNES Standardized susceptibility testing for this organism is not available. Performed at [facility], [place] [address], [place], [place] [address]    Report Status 12/25/2017 FINAL  Final  Fungus Culture With Stain     Status: None (Preliminary result)   Collection Time: 12/20/17 12:00 PM  Result Value Ref Range Status   Fungus Stain Final report  Final    Comment: (NOTE) Performed At: [facility] [place], [place] [id number] Rush Farmer MD [id number]    Fungus (Mycology) Culture PENDING  Incomplete   Fungal Source LYMPH NODULE  Final    Comment: Performed at [facility], [place] [address], [place], [place] [address]  Acid Fast Smear (AFB)     Status: None   Collection Time: 12/20/17 12:00 PM  Result Value Ref Range Status   AFB Specimen Processing Concentration  Final   Acid Fast Smear Negative  Final    Comment: (NOTE) Performed At: [facility] [place], [place] [id number] Rush Farmer MD [id number]    Source (AFB) LYMPH NODULE  Final    Comment:  Performed at [facility], [place] [address] [place], [place] [address]  Fungus Culture Result     Status: None   Collection Time: 12/20/17 12:00 PM  Result Value Ref Range Status   Result 1 Comment  Final    Comment: (NOTE) KOH/Calcofluor preparation:  no fungus observed. Performed At: [facility] [address] [place], [place] [id number] Rush Farmer MD DX:[id number]         [place]       [place] [address]             [phone]      Siddiq Troia [place] Medical Record #[id number] Date of Birth: 26-Mar-1955  Referring: Daisy Lazar, MD Primary Care: Majel Homer, [place] Primary Cardiologist: No primary care provider on file.   Chief Complaint:   POST OP FOLLOW UP 12/20/2017 PREOPERATIVE DIAGNOSIS:  Bilateral hilar adenopathy suggestive of granulomatous disease. POSTOPERATIVE DIAGNOSIS:  Bilateral hilar adenopathy suggestive of granulomatous disease. SURGICAL PROCEDURE:  Bronchoscopy with EBUS and multiple transbronchial biopsies. SURGEON:  Lanelle Bal, MD  History of Present Illness:       Diagnosis Lymph node, biopsy, #7 - GRANULOMATA IN LYMPH NODE, SEE COMMENT. Microscopic Comment Immunohistochemistry for CD163 is positive in epithelioid histiocytes of non-necrotizing granulomata. CKAE1AE3 is negative. Special stains AFB and GMS are negative for acid fast bacillus and fungus, respectively. Helyn App  Diagnosis FINE NEEDLE ASPIRATION, EBUS, 7, A (SPECIMEN 1 OF 1, COLLECTED 12/20/17): NO MALIGNANT CELLS IDENTIFIED. 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 Anxiety   . Arthritis    back, ankles  . Chronic kidney disease    high ,low, no meds  . Complication of anesthesia    low oxygen saturation and PMH  of aspiration during upper GI procedure   . Depression   . GERD (gastroesophageal reflux disease)   . Headache   . Hypertension   . Mediastinal lymphadenopathy   . Pneumonia   . Pre-diabetes   . Requires supplemental oxygen    when lying down  . Seizures ([place])      Social History   Tobacco Use  Smoking Status Never Smoker  Smokeless Tobacco Never Used    Social History   Substance and Sexual Activity  Alcohol Use No     Allergies  Allergen Reactions  . Gabapentin Other (See Comments)    Makes him go wild, not himself  . Augmentin [Amoxicillin-Pot Clavulanate] Other (See Comments)    GI problems  . Doxycycline Nausea And Vomiting    Current Outpatient Medications  Medication Sig Dispense Refill  . amLODipine (NORVASC) 10 MG tablet Take 10 mg by mouth daily.    . busPIRone (BUSPAR) 10 MG tablet Take 5 mg by mouth 2 (two) times daily.   3  . butalbital-acetaminophen-caffeine (FIORICET, ESGIC) 50-325-40 MG tablet Take 1 tablet by mouth daily as needed for headache.    . cyclobenzaprine (FLEXERIL) 10 MG tablet Take 10 mg by mouth 2 (two) times daily.     Marland Kitchen JARDIANCE 25 MG TABS tablet Take 25 mg by mouth daily.  3  . meloxicam (MOBIC) 15 MG tablet Take 15 mg by mouth daily.    . nortriptyline (PAMELOR) 25 MG capsule Take 25 mg by mouth daily.   3  . omeprazole (PRILOSEC) 40 MG capsule Take 40 mg by mouth daily.   3  . oxyCODONE-acetaminophen (PERCOCET) 10-325 MG tablet Take 1 tablet by mouth every 8 (eight) hours as needed for pain.    . phenytoin (DILANTIN) 100 MG ER capsule Take 200-300 mg by mouth See admin instructions. Take 300 mg by mouth in the morning and take 200 mg by mouth at bedtime    . predniSONE (DELTASONE) 10 MG tablet Take 10 mg by mouth daily.   1  . QUEtiapine (SEROQUEL) 100 MG tablet Take 100 mg by mouth at bedtime.   3  . rOPINIRole (REQUIP) 0.5 MG tablet Take 0.5 mg by mouth daily.     No current facility-administered medications for this visit.         Physical Exam: BP (!) 143/98   Pulse 87   Resp 20   Ht 6\' 2"  (1.88 m)   Wt (!) 302 lb (137 kg)   SpO2 94% Comment: ON RA  BMI 38.77 kg/m   General appearance: alert and cooperative Neurologic: intact Heart: regular rate and rhythm, S1, S2 normal, no murmur, click, rub or gallop Lungs: clear to auscultation bilaterally Abdomen: soft, non-tender; bowel sounds normal; no masses,  no organomegaly Extremities: extremities normal, atraumatic, no cyanosis or edema and Homans sign is negative, no sign of DVT   Diagnostic Studies & Laboratory data:     Recent Radiology Findings:   No results found.    Recent  Lab Findings: Lab Results  Component Value Date   WBC 10.1 12/20/2017   HGB 15.4 12/20/2017   HCT 50.4 12/20/2017   PLT 249 12/20/2017   GLUCOSE 168 (H) 12/20/2017   ALT 30 12/20/2017   AST 25 12/20/2017   NA 138 12/20/2017   K 4.1 12/20/2017   CL 105 12/20/2017   CREATININE 1.35 (H) 12/20/2017   BUN 18 12/20/2017   CO2 24 12/20/2017   INR 1.00 12/20/2017   HGBA1C 6.1 (H) 12/20/2017      Assessment / Plan:   Patient returns after ebus and mediastinal node biopsy Both cytology and pathology show granulomatous inflammation To date cultures have been negative for AFB or fungal cultures Patient has been on 45 days of steroids. I have given him copies of his microbiology reports and path and cytology reports and he will return to see Dr. Luciana Axe in [place] and follow-up.   Grace Isaac MD      [place] [place] [address] Office [phone]   Beeper [phone]  12/26/2017 9:49 AM

## 2017-12-26 NOTE — Telephone Encounter (Signed)
Faxed patient's culture and path results per Dr. Everrett Coombe request to Dr. Daisy Lazar at [facility].

## 2017-12-26 NOTE — Patient Instructions (Signed)
Results for orders placed or performed during the hospital encounter of 12/20/17  Surgical pcr screen     Status: None   Collection Time: 12/20/17  9:19 AM  Result Value Ref Range Status   MRSA, PCR NEGATIVE NEGATIVE Final   Staphylococcus aureus NEGATIVE NEGATIVE Final    Comment: (NOTE) The Xpert SA Assay (FDA approved for NASAL specimens in patients 60 years of age and older), is one component of a comprehensive surveillance program. It is not intended to diagnose infection nor to guide or monitor treatment. Performed at [facility], [place] [address] [place], [place] [address]   Anaerobic culture     Status: None   Collection Time: 12/20/17 12:00 PM  Result Value Ref Range Status   Specimen Description LYMPH NODE  Final   Special Requests NONE  Final   Gram Stain NO WBC SEEN NO ORGANISMS SEEN   Final   Culture   Final    RARE PROPIONIBACTERIUM ACNES Standardized susceptibility testing for this organism is not available. Performed at [facility], [place] [address], [place], [place] [address]    Report Status 12/25/2017 FINAL  Final  Fungus Culture With Stain     Status: None (Preliminary result)   Collection Time: 12/20/17 12:00 PM  Result Value Ref Range Status   Fungus Stain Final report  Final    Comment: (NOTE) Performed At: [facility] [place], [place] [id number] Rush Farmer MD [id number]    Fungus (Mycology) Culture PENDING  Incomplete   Fungal Source LYMPH NODULE  Final    Comment: Performed at [facility], [place] [address] [place], [place] [address]  Acid Fast Smear (AFB)     Status: None   Collection Time: 12/20/17 12:00 PM  Result Value Ref Range Status   AFB Specimen Processing Concentration  Final   Acid Fast Smear Negative  Final    Comment: (NOTE) Performed At: [facility] [place], [place] [id number] Rush Farmer MD [id number]    Source (AFB) LYMPH NODULE  Final    Comment:  Performed at [facility], [place] [address] [place], [place] [address]  Fungus Culture Result     Status: None   Collection Time: 12/20/17 12:00 PM  Result Value Ref Range Status   Result 1 Comment  Final    Comment: (NOTE) KOH/Calcofluor preparation:  no fungus observed. Performed At: [facility] [place], [place] [id number] Rush Farmer MD [id number]

## 2018-01-23 LAB — FUNGUS CULTURE WITH STAIN

## 2018-01-23 LAB — FUNGAL ORGANISM REFLEX

## 2018-01-23 LAB — FUNGUS CULTURE RESULT

## 2018-02-03 LAB — ACID FAST CULTURE WITH REFLEXED SENSITIVITIES (MYCOBACTERIA): Acid Fast Culture: NEGATIVE

## 2019-02-03 ENCOUNTER — Encounter: Payer: Self-pay | Admitting: Internal Medicine

## 2019-02-03 ENCOUNTER — Ambulatory Visit: Payer: Medicare HMO | Admitting: Internal Medicine

## 2019-02-03 ENCOUNTER — Ambulatory Visit (INDEPENDENT_AMBULATORY_CARE_PROVIDER_SITE_OTHER): Payer: Medicare HMO

## 2019-02-03 ENCOUNTER — Other Ambulatory Visit: Payer: Self-pay

## 2019-02-03 DIAGNOSIS — R0609 Other forms of dyspnea: Secondary | ICD-10-CM | POA: Insufficient documentation

## 2019-02-03 DIAGNOSIS — R0600 Dyspnea, unspecified: Secondary | ICD-10-CM

## 2019-02-03 DIAGNOSIS — D869 Sarcoidosis, unspecified: Secondary | ICD-10-CM | POA: Diagnosis not present

## 2019-02-03 MED ORDER — OMEPRAZOLE 40 MG PO CPDR: DELAYED_RELEASE_CAPSULE | ORAL | 3 refills | Status: DC

## 2019-02-03 NOTE — Progress Notes (Signed)
Corey Griffith, male    DOB: Nov 03, 1955,    MRN: [id number]   Brief patient profile:  15 yowm never smoker healthy until  mid 30's tendency to bronchitis more in winter but around 2015 aspirated related to GI procedures in [place] and admitted and treated pna > went home on 02 to use whenever supine and then  Back problems/  surgery > gradually downhill since so self referred to pulmonary clinic 02/03/2019 with dx of sarcoid made by EBUS/ Corey Griffith  12/20/2017     History of Present Illness  02/03/2019  Pulmonary/ 1st office eval/Corey Griffith  Chief Complaint  Patient presents with  . Consult    Patient is here for possible sacoidosis. Patient has shortness of breath and dry cough. 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 Cough: dry cough worse when go cold to warm /sneezing fits assoc with nasal  - sporadic otherwise  Sleep: on side / one pillow no cough  SABA use:  Has one, not using  Arthritis ankles and knees  Sweats several night to point of changing clothes No ex cp / heart cath later this month On ACTAR since ? Summer 2020 and "it shrunk the lymph nodes" but still having soaking sweats  No obvious day to day or daytime variability or assoc excess/ purulent sputum or mucus plugs or hemoptysis or  chest tightness, subjective wheeze or overt sinus or hb symptoms.   Sleeping as above  without nocturnal  or early am exacerbation  of respiratory  c/o's or need for noct saba. Also denies any obvious fluctuation of symptoms with weather or environmental changes or other aggravating or alleviating factors except as outlined above   No unusual exposure hx or h/o childhood pna/ asthma or knowledge of premature birth.  Current Allergies, Complete Past Medical History, Past Surgical History,  Family History, and Social History were reviewed in [employer] record.  ROS  The following are not active complaints unless bolded Hoarseness, sore throat, dysphagia, dental problems, itching, sneezing,  nasal congestion or discharge of excess mucus or purulent secretions, ear ache,   fever, chills, sweats/hot flashes, unintended wt loss or wt gain, classically pleuritic or exertional cp,  orthopnea pnd or arm/hand swelling  or leg swelling, presyncope, palpitations, abdominal pain, anorexia, nausea, vomiting, diarrhea  or change in bowel habits or change in bladder habits, change in stools or change in urine, dysuria, hematuria,  rash, arthralgias, visual complaints, headache, numbness, weakness or ataxia or problems with walking or coordination,  change in mood or  memory.             Past Medical History:  Diagnosis Date  . Anxiety   . Arthritis    back, ankles  . Chronic kidney disease    high ,low, no meds  . Complication of anesthesia    low oxygen saturation and PMH of aspiration during upper GI procedure   . Depression   . GERD (gastroesophageal reflux disease)   . Headache   . Hypertension   . Mediastinal lymphadenopathy   . Pneumonia   . Pre-diabetes   . Requires supplemental oxygen    when lying down  . Seizures ([place])     Outpatient Medications Prior to Visit - -  NOTE:   Unable to verify as accurately reflecting what pt takes/ also on actar shots     Medication Sig Dispense Refill  . amLODipine (NORVASC) 10 MG tablet Take 5 mg by mouth daily.     . baclofen (LIORESAL) 10 MG tablet Take 10 mg by mouth 3 (three) times daily.    . busPIRone (BUSPAR) 10 MG tablet Take 15 mg by mouth 2 (two) times daily.   3  . butalbital-acetaminophen-caffeine (FIORICET, ESGIC) 50-325-40 MG tablet Take 1 tablet by mouth daily as needed for headache.    Marland Kitchen JARDIANCE 25 MG TABS tablet Take 25 mg by mouth daily.  3  . meloxicam (MOBIC) 15 MG tablet Take 15 mg by mouth  as needed.     . nortriptyline (PAMELOR) 25 MG capsule Take 25 mg by mouth daily.   3  . omeprazole (PRILOSEC) 40 MG capsule Take 40 mg by mouth daily.   3  . oxyCODONE-acetaminophen (PERCOCET) 10-325 MG tablet Take 1 tablet by mouth every 8 (eight) hours as needed for pain.    . OXYGEN Inhale 2 L into the lungs. 2L at night or when patient is laying down    . phenytoin (DILANTIN) 100 MG ER capsule Take 200-300 mg by mouth See admin instructions. Take 300 mg by mouth in the morning and take 200 mg by mouth at bedtime    . QUEtiapine (SEROQUEL) 100 MG tablet Take 100 mg by mouth at bedtime.   3  . rOPINIRole (REQUIP) 0.5 MG tablet Take 0.5 mg by mouth daily.    . cyclobenzaprine (FLEXERIL) 10 MG tablet Take 10 mg by mouth 2 (two) times daily.     .              Objective:     BP 130/88 (BP Location: Left Arm, Patient Position: Sitting, Cuff Size: Large)   Pulse 62   Temp 97.6 F (36.4 C) (Temporal)   Ht 6' 2.8" (1.9 m)   Wt (!) 313 lb 3.2 oz (142.1 kg)   SpO2 95% Comment: on RA  BMI 39.35 kg/m   SpO2: 95 %(on RA)   Obese slt hoarse wm nad   HEENT : pt wearing mask not removed for exam due to covid -19 concerns.    NECK :  without JVD/Nodes/TM/ nl carotid upstrokes bilaterally   LUNGS: no acc muscle use,  Nl contour chest which is clear to A and P bilaterally without cough on insp or exp maneuvers   CV:  RRR  no s3 or murmur or increase in P2, and no edema   ABD: obese/  soft and nontender with nl inspiratory excursion in the supine position. No bruits or organomegaly appreciated, bowel sounds nl  MS:  Nl gait/ ext warm without deformities, calf tenderness, cyanosis or clubbing No obvious joint restrictions   SKIN: warm and dry without lesions    NEURO:  alert, approp, nl sensorium with  no motor or cerebellar deficits apparent.    CXR PA and Lateral:   02/03/2019 :    I personally reviewed images and agree with radiology impression as follows:    1.   Cardiomegaly.  2. Probable mild scarring or atelectasis at the left lung base, not significantly changed compared to prior examination. There are no specific radiographic findings to suggest sarcoidosis. CT may be used to further evaluate if desired.   Labs ordered/ reviewed:      Chemistry      Component Value Date/Time  NA 138 02/03/2019 1534   K 4.6 02/03/2019 1534   CL 103 02/03/2019 1534   CO2 28 02/03/2019 1534   BUN 16 02/03/2019 1534   CREATININE 1.23 02/03/2019 1534      Component Value Date/Time   CALCIUM 9.4 02/03/2019 1534   ALKPHOS 89 02/03/2019 1534   AST 20 02/03/2019 1534   ALT 26 02/03/2019 1534   BILITOT 0.4 02/03/2019 1534        Lab Results  Component Value Date   WBC 11.8 (H) 02/03/2019   HGB 14.4 02/03/2019   HCT 43.7 02/03/2019   MCV 93.2 02/03/2019   PLT 262.0 02/03/2019       EOS                                                               0.4                                    02/03/2019      Lab Results  Component Value Date   TSH 1.23 02/03/2019         Lab Results  Component Value Date   ESRSEDRATE 33 (H) 02/03/2019       Labs ordered 02/03/2019  :  Angiotensin      Assessment   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 Taper off  - Angiotensin 02/03/2019  49 - ESR 02/03/2019   33   A good rule of thumb is that >95% of pts with active sarcoid in any organ will have some plain cxr changes - on the other hand  if there are active pulmonary symptoms the cxr will look much worse than the patient:  No evidence of either scenario here/ strongly doubt active dz  - I can see the adenopathy on a lateral view from 2014 which predates his symptom and note even on acthar there really is no convincing improvement in anything but "the nodes are smaller" which is the pt's only positive response to any interventions to date but is  clinically moot point as there is no indication to treat sarcoid based on node size.  rec return with all meds in hand in 4 weeks  to sort thru symptoms further with LHC in meantime.       DOE (dyspnea on exertion) Onset ? 2015 p aspirated from gi procedure - PFTs 01/23/14 no obst, minimal restriction. - 02/03/2019   Walked RA x two laps =  approx 5106f @ slow pace - stopped due to end of study  with sats of 97% at the end of the study s sob but said he couldn't walk any faster due to back.  Symptoms are markedly disproportionate to objective findings and not clear to what extent this is actually a pulmonary  problem but pt does appear to have difficult to sort out respiratory symptoms of unknown origin for which  DDX  = almost all start with A and  include Adherence, Ace Inhibitors, Acid Reflux, Active Sinus Disease, Alpha 1 Antitripsin deficiency, Anxiety masquerading as Airways dz,  ABPA,  Allergy(esp in young), Aspiration (esp in elderly), Adverse effects of meds,  Active smoking or Vaping, A bunch of PE's/clot burden (a few small clots can't cause this syndrome unless there is already severe underlying pulm or vascular dz with poor reserve),  Anemia or thyroid disorder, plus two Bs  = Bronchiectasis and Beta blocker use..and one C= CHF     Adherence is always the initial "prime suspect" and is a multilayered concern that requires a "trust but verify" approach in every patient - starting with knowing how to use medications, especially inhalers, correctly, keeping up with refills and understanding the fundamental difference between maintenance and prns vs those medications only taken for a very short course and then stopped and not refilled.  - with all meds in hand using a trust but verify approach to confirm accurate Medication  Reconciliation The principal here is that until we are certain that the  patients are doing what we've asked, it makes no sense to ask them to do more.   To keep things  simple, I have asked the patient to first separate medicines that are perceived as maintenance, that is to be taken daily "no matter what", from those medicines that are taken on only on an as-needed basis and I have given the patient examples of both, and then return to see me and go from there  ? Acid (or non-acid) GERD > always difficult to exclude as up to 75% of pts in some series report no assoc GI/ Heartburn symptoms> rec max (24h)  acid suppression and diet restrictions/ reviewed and instructions given in writing.   ? Anxiety /depression / obesity/ deconditioning > usually at the bottom of this list of usual suspects but should be much higher on this pt's based on H and P and note already on psychotropics and may interfere with adherence and also interpretation of response or lack thereof to symptom management which can be quite subjective.   ? Anemia/ thyroid dz > ruled out   ? Adverse drug effects > none of the usual suspects listed   ? CHF >  Cards w/u in progress/ return to regroup when complete.     Total time devoted to counseling  > 50 % of initial 60 min office visit:  reviewed case with pt/wife/  directly observed portions of ambulatory 02 saturation study/  discussion of options/alternatives/ personally creating written customized instructions  in presence of pt  then going over those specific  Instructions directly with the pt including how to use all of the meds but in particular covering each new medication in detail and the difference between the maintenance= "automatic" meds and the prns using an action plan format for the latter (If this problem/symptom => do that organization reading Left to right).  Please see AVS from this visit for a full list of these instructions which I personally wrote for this pt and  are unique to this visit.     Christinia Gully, MD 02/03/2019

## 2019-02-03 NOTE — Patient Instructions (Addendum)
Continue to walk as much as you can   Please remember to go to the lab and x-ray department   for your tests - we will call you with the results when they are available.  Take prilosec (omeprazole)  Take 30- 60 min before your first and last meals of the day   GERD (REFLUX)  is an extremely common cause of respiratory symptoms just like yours , many times with no obvious heartburn at all.    It can be treated with medication, but also with lifestyle changes including elevation of the head of your bed (ideally with 6 -8inch blocks under the headboard of your bed),  Smoking cessation, avoidance of late meals, excessive alcohol, and avoid fatty foods, chocolate, peppermint, colas, red wine, and acidic juices such as orange juice.  NO MINT OR MENTHOL PRODUCTS SO NO COUGH DROPS  USE SUGARLESS CANDY INSTEAD (Jolley ranchers or Stover's or Life Savers) or even ice chips will also do - the key is to swallow to prevent all throat clearing. NO OIL BASED VITAMINS - use powdered substitutes.  Avoid fish oil when coughing.     Please schedule a follow up office visit in 4 weeks, call sooner if needed with all medications /inhalers/ solutions in hand so we can verify exactly what you are taking. This includes all medications from all doctors and over the [place] separate them into two bags:  the ones you take automatically, no matter what, vs the ones you take just when you feel you need them "BAG #2 is UP TO YOU"  - this will really help [place] help you take your medications more effectively.  - ok to change to [facility] office if this works for you better though we may not have it open to schedule there yet

## 2019-02-04 ENCOUNTER — Encounter: Payer: Self-pay | Admitting: Internal Medicine

## 2019-02-04 DIAGNOSIS — D869 Sarcoidosis, unspecified: Secondary | ICD-10-CM | POA: Insufficient documentation

## 2019-02-04 LAB — CBC WITH DIFFERENTIAL/PLATELET
Basophils Absolute: 0.1 10*3/uL (ref 0.0–0.1)
Basophils Relative: 1 % (ref 0.0–3.0)
Eosinophils Absolute: 0.4 10*3/uL (ref 0.0–0.7)
Eosinophils Relative: 3 % (ref 0.0–5.0)
HCT: 43.7 % (ref 39.0–52.0)
Hemoglobin: 14.4 g/dL (ref 13.0–17.0)
Lymphocytes Relative: 27.1 % (ref 12.0–46.0)
Lymphs Abs: 3.2 10*3/uL (ref 0.7–4.0)
MCHC: 33 g/dL (ref 30.0–36.0)
MCV: 93.2 fl (ref 78.0–100.0)
Monocytes Absolute: 1 10*3/uL (ref 0.1–1.0)
Monocytes Relative: 8.5 % (ref 3.0–12.0)
Neutro Abs: 7.1 10*3/uL (ref 1.4–7.7)
Neutrophils Relative %: 60.4 % (ref 43.0–77.0)
Platelets: 262 10*3/uL (ref 150.0–400.0)
RBC: 4.69 Mil/uL (ref 4.22–5.81)
RDW: 14.2 % (ref 11.5–15.5)
WBC: 11.8 10*3/uL — ABNORMAL HIGH (ref 4.0–10.5)

## 2019-02-04 LAB — HEPATIC FUNCTION PANEL
ALT: 26 U/L (ref 0–53)
AST: 20 U/L (ref 0–37)
Albumin: 4.3 g/dL (ref 3.5–5.2)
Alkaline Phosphatase: 89 U/L (ref 39–117)
Bilirubin, Direct: 0.1 mg/dL (ref 0.0–0.3)
Total Bilirubin: 0.4 mg/dL (ref 0.2–1.2)
Total Protein: 7.6 g/dL (ref 6.0–8.3)

## 2019-02-04 LAB — BASIC METABOLIC PANEL
BUN: 16 mg/dL (ref 6–23)
CO2: 28 mEq/L (ref 19–32)
Calcium: 9.4 mg/dL (ref 8.4–10.5)
Chloride: 103 mEq/L (ref 96–112)
Creatinine, Ser: 1.23 mg/dL (ref 0.40–1.50)
GFR: 59.77 mL/min — ABNORMAL LOW (ref >60.00)
Glucose, Bld: 103 mg/dL — ABNORMAL HIGH (ref 70–99)
Potassium: 4.6 mEq/L (ref 3.5–5.1)
Sodium: 138 mEq/L (ref 135–145)

## 2019-02-04 LAB — TSH: TSH: 1.23 u[IU]/mL (ref 0.35–4.50)

## 2019-02-04 LAB — SEDIMENTATION RATE: Sed Rate: 33 mm/hr — ABNORMAL HIGH (ref 0–20)

## 2019-02-04 LAB — IGE: IgE (Immunoglobulin E), Serum: 86 kU/L (ref <=114)

## 2019-02-04 LAB — ANGIOTENSIN CONVERTING ENZYME: Angiotensin-Converting Enzyme: 49 U/L (ref 9–67)

## 2019-02-04 NOTE — Assessment & Plan Note (Signed)
Onset ? 2015 p aspirated from gi procedure - PFTs 01/23/14 no obst, minimal restriction. - 02/03/2019   Walked RA x two laps =  approx 547ft @ slow pace - stopped due to end of study  with sats of 97% at the end of the study s sob but said he couldn't walk any faster due to back.  Symptoms are markedly disproportionate to objective findings and not clear to what extent this is actually a pulmonary  problem but pt does appear to have difficult to sort out respiratory symptoms of unknown origin for which  DDX  = almost all start with A and  include Adherence, Ace Inhibitors, Acid Reflux, Active Sinus Disease, Alpha 1 Antitripsin deficiency, Anxiety masquerading as Airways dz,  ABPA,  Allergy(esp in young), Aspiration (esp in elderly), Adverse effects of meds,  Active smoking or Vaping, A bunch of PE's/clot burden (a few small clots can't cause this syndrome unless there is already severe underlying pulm or vascular dz with poor reserve),  Anemia or thyroid disorder, plus two Bs  = Bronchiectasis and Beta blocker use..and one C= CHF     Adherence is always the initial "prime suspect" and is a multilayered concern that requires a "trust but verify" approach in every patient - starting with knowing how to use medications, especially inhalers, correctly, keeping up with refills and understanding the fundamental difference between maintenance and prns vs those medications only taken for a very short course and then stopped and not refilled.  - with all meds in hand using a trust but verify approach to confirm accurate Medication  Reconciliation The principal here is that until we are certain that the  patients are doing what we've asked, it makes no sense to ask them to do more.   To keep things simple, I have asked the patient to first separate medicines that are perceived as maintenance, that is to be taken daily "no matter what", from those medicines that are taken on only on an as-needed basis and I have given  the patient examples of both, and then return to see me and go from there  ? Acid (or non-acid) GERD > always difficult to exclude as up to 75% of pts in some series report no assoc GI/ Heartburn symptoms> rec max (24h)  acid suppression and diet restrictions/ reviewed and instructions given in writing.   ? Anxiety /depression / obesity/ deconditioning > usually at the bottom of this list of usual suspects but should be much higher on this pt's based on H and P and note already on psychotropics and may interfere with adherence and also interpretation of response or lack thereof to symptom management which can be quite subjective.   ? Anemia/ thyroid dz > ruled out   ? Adverse drug effects > none of the usual suspects listed   ? CHF >  Cards w/u in progress/ return to regroup when complete.   Total time devoted to counseling  > 50 % of initial 60 min office visit:  reviewed case with pt/wife/  directly observed portions of ambulatory 02 saturation study/  discussion of options/alternatives/ personally creating written customized instructions  in presence of pt  then going over those specific  Instructions directly with the pt including how to use all of the meds but in particular covering each new medication in detail and the difference between the maintenance= "automatic" meds and the prns using an action plan format for the latter (If this problem/symptom => do that  organization reading Left to right).  Please see AVS from this visit for a full list of these instructions which I personally wrote for this pt and  are unique to this visit.

## 2019-02-04 NOTE — Assessment & Plan Note (Signed)
NCG on EBUS  12/20/2017  - prednisone rx initially > wt gain  - Acthar rx summer 2020 >>> 02/03/2019  rec continue x 4 weeks then ? Taper off  - Angiotensin 02/03/2019  49 - ESR 02/03/2019   33   A good rule of thumb is that >95% of pts with active sarcoid in any organ will have some plain cxr changes - on the other hand  if there are active pulmonary symptoms the cxr will look much worse than the patient:  No evidence of either scenario here/ strongly doubt active dz  - I can see the adenopathy on a lateral view from 2014 which predates his symptom and note even on acthar there really is no convincing improvement in anything but "the nodes are smaller" which is the pt's only positive response to any interventions to date but is clinically moot point as there is no indication to treat sarcoid based on node size.  rec return with all meds in hand in 4 weeks  to sort thru symptoms further with LHC in meantime.

## 2019-03-23 ENCOUNTER — Encounter: Payer: Self-pay | Admitting: Internal Medicine

## 2019-03-23 ENCOUNTER — Ambulatory Visit: Payer: Medicare HMO | Admitting: Internal Medicine

## 2019-03-23 ENCOUNTER — Other Ambulatory Visit: Payer: Self-pay

## 2019-03-23 DIAGNOSIS — R0609 Other forms of dyspnea: Secondary | ICD-10-CM

## 2019-03-23 DIAGNOSIS — R0600 Dyspnea, unspecified: Secondary | ICD-10-CM | POA: Diagnosis not present

## 2019-03-23 DIAGNOSIS — D869 Sarcoidosis, unspecified: Secondary | ICD-10-CM | POA: Diagnosis not present

## 2019-03-23 MED ORDER — PREDNISONE 10 MG PO TABS: ORAL_TABLET | ORAL | 0 refills | Status: AC

## 2019-03-23 NOTE — Patient Instructions (Addendum)
Take half the dose of actar twice weekly then one quarter twice daily x 2 weeks then stop  If getting worse  Short of breath, cough bad fatigue, nausea, worse than usual arthritis   start back on prednisone 10 mg daily  Until better then one half daily x one week then stop - if worse start back over.    Clinoril (sulindac) is safer than meloxicam for your kidneys    Please schedule a follow up office visit in 6 weeks, call sooner if needed

## 2019-03-23 NOTE — Progress Notes (Signed)
Corey Griffith, male    DOB: 1955-06-26,    MRN: [id number]   Brief patient profile:  68 yowm never smoker healthy until  mid 30's tendency to bronchitis more in winter but around 2015 aspirated related to GI procedures in [place] and admitted and treated pna > went home on 02 to use whenever supine and then  Back problems/  surgery > gradually downhill since so self referred to pulmonary clinic 02/03/2019 with dx of sarcoid made by EBUS/ [name]  12/20/2017     History of Present Illness  02/03/2019  Pulmonary/ 1st office eval/Takiera Mayo  Chief Complaint  Patient presents with  . Consult    Patient is here for possible sacoidosis. Patient has shortness of breath and dry cough. 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 Cough: dry cough worse when go cold to warm /sneezing fits assoc with nasal  - sporadic otherwise  Sleep: on side / one pillow no cough  SABA use:  Has one, not using  Arthritis ankles and knees  Sweats several night to point of changing clothes No ex cp / heart cath later this month On ACTAR since ? Summer 2020 and "it shrunk the lymph nodes" but still having soaking sweats rec Continue to walk as much as you can  Take prilosec (omeprazole)  Take 30- 60 min before your first and last meals of the day  GERD  diet Please schedule a follow up office visit in 4 weeks    03/23/2019  f/u ov/Brindle Leyba re:  ? Sarcoid still on Actar  Chief Complaint  Patient presents with  . Follow-up    SOB and cough are unchanged.   Dyspnea:  MMRC3 = can't walk 100 yards even at a slow pace at a flat grade s stopping due to sob and arthritis both limiting  Cough:  Daytime sporadic, non productive  Sleeping: sleeping on side with 1-2 pillows, bed is flat  SABA use: none 02:  2lpm hs     No obvious day to day or daytime variability or assoc excess/ purulent sputum or mucus plugs or hemoptysis or cp or chest tightness, subjective wheeze or overt sinus or hb symptoms.      without nocturnal  or early am exacerbation  of respiratory  c/o's or need for noct saba. Also denies any obvious fluctuation of symptoms with weather or environmental changes or other aggravating or alleviating factors except as outlined above   No unusual exposure hx or h/o childhood pna/ asthma or knowledge of premature birth.  Current Allergies, Complete Past Medical History, Past Surgical History, Family History, and Social History were reviewed in [employer] record.  ROS  The following are not active complaints unless bolded Hoarseness, sore throat, dysphagia, dental problems, itching, sneezing,  nasal congestion or discharge of excess mucus or purulent secretions, ear ache,   fever, chills, sweats, unintended wt loss or wt gain, classically pleuritic or exertional cp,  orthopnea pnd or arm/hand swelling  or leg swelling, presyncope, palpitations, abdominal pain, anorexia, nausea, vomiting, diarrhea  or change in bowel habits or change in bladder habits, change in stools or change in urine, dysuria, hematuria,  rash, arthralgias, visual complaints, headache, numbness, weakness or ataxia or problems with walking  or coordination,  change in mood or  memory.        Current Meds  Medication Sig  . albuterol (VENTOLIN HFA) 108 (90 Base) MCG/ACT inhaler Inhale 2 puffs into the lungs every 4 (four) hours as needed.  Marland Kitchen amLODipine (NORVASC) 5 MG tablet Take 5 mg by mouth daily.  Marland Kitchen atenolol (TENORMIN) 50 MG tablet Take 1 tablet by mouth daily.  . baclofen (LIORESAL) 10 MG tablet Take 10 mg by mouth 3 (three) times daily.  . busPIRone (BUSPAR) 10 MG tablet Take 15 mg by mouth 2 (two) times daily.   Marland Kitchen JARDIANCE 25 MG TABS tablet Take 25 mg by mouth daily.  Marland Kitchen LIPITOR 80 MG tablet Take 80 mg  by mouth daily.  . meloxicam (MOBIC) 15 MG tablet Take 15 mg by mouth as needed.   . nortriptyline (PAMELOR) 25 MG capsule Take 25 mg by mouth daily.   Marland Kitchen omeprazole (PRILOSEC) 40 MG capsule Take 30- 60 min before your first and last meals of the day  . oxyCODONE-acetaminophen (PERCOCET) 10-325 MG tablet Take 1 tablet by mouth every 8 (eight) hours as needed for pain.  . OXYGEN Inhale 2 L into the lungs. 2L at night or when patient is laying down  . phenytoin (DILANTIN) 100 MG ER capsule Take 200-300 mg by mouth See admin instructions. Take 300 mg by mouth in the morning and take 200 mg by mouth at bedtime  . prasugrel (EFFIENT) 10 MG TABS tablet Take 10 mg by mouth daily.  . Prucalopride Succinate (MOTEGRITY) 2 MG TABS Take 1 tablet by mouth daily.  . QUEtiapine (SEROQUEL) 100 MG tablet Take 100 mg by mouth at bedtime.   Marland Kitchen rOPINIRole (REQUIP) 0.5 MG tablet Take 0.5 mg by mouth daily.  Marland Kitchen UNABLE TO FIND Med Name: Actar 5 ml 2 x per wk per pt                Past Medical History:  Diagnosis Date  . Anxiety   . Arthritis    back, ankles  . Chronic kidney disease    high ,low, no meds  . Complication of anesthesia    low oxygen saturation and PMH of aspiration during upper GI procedure   . Depression   . GERD (gastroesophageal reflux disease)   . Headache   . Hypertension   . Mediastinal lymphadenopathy   . Pneumonia   . Pre-diabetes   . Requires supplemental oxygen    when lying down  . Seizures ([place])        Objective:    Wt Readings from Last 3 Encounters:  03/23/19 (!) 311 lb (141.1 kg)  02/03/19 (!) 313 lb 3.2 oz (142.1 kg)  12/26/17 (!) 302 lb (137 kg)     Vital signs reviewed - Note on arrival 03/23/2019  02 sats  94% on RA  Ambulatory somber wm nad     HEENT : pt wearing mask not removed for exam due to covid -19 concerns.    NECK :  without JVD/Nodes/TM/ nl carotid upstrokes bilaterally   LUNGS: no acc muscle use,  Nl contour chest which is clear to A  and P bilaterally without cough on insp or exp maneuvers   CV:  RRR  no s3 or murmur or increase in P2, and no edema   ABD:  soft and nontender with nl inspiratory excursion in the supine position. No bruits or organomegaly appreciated, bowel sounds nl  MS:  Nl gait/ ext warm without  deformities, calf tenderness, cyanosis or clubbing No obvious joint restrictions   SKIN: warm and dry without lesions    NEURO:  alert, approp, nl sensorium with  no motor or cerebellar deficits apparent.                  Assessment      .

## 2019-03-24 ENCOUNTER — Encounter: Payer: Self-pay | Admitting: Internal Medicine

## 2019-03-24 NOTE — Assessment & Plan Note (Signed)
NCG on EBUS  12/20/2017  - prednisone rx initially > wt gain  - Acthar rx summer 2020 >>> 02/03/2019  rec continue x 4 weeks then ? Taper off  - Angiotensin 02/03/2019  49 - ESR 02/03/2019   33  - 03/23/2019 rec wean off actar and regroup in 6 weeks   The arthritis and sweats are unlikely to be due to sarcoid so rec he wean off actar at this point and see what if any resp or systemic symptoms flare off it and if so ok to restart prednisone short term but no higher than 20 mg per day.   For arthritis related to sarcoid or other causes clinoril would be better choice than meloxicam as the former spares renal prostaglandins / advised          Each maintenance medication was reviewed in detail including emphasizing most importantly the difference between maintenance and prns and under what circumstances the prns are to be triggered using an action plan format where appropriate.  Total time for H and P, chart review, counseling,  directly observing portions of ambulatory 02 saturation study/ and generating customized AVS unique to this office visit / charting = 30 min

## 2019-03-24 NOTE — Assessment & Plan Note (Signed)
Onset ? 2015 p aspirated from gi procedure - PFTs 01/23/14 no obst, minimal restriction. - 02/03/2019   Walked RA x two laps =  approx 5110ft @ slow pace - stopped due to end of study  with sats of 97% at the end of the study s sob but said he couldn't walk any faster due to back. - 02/03/2019  IgE  = 86 - 03/23/2019   Walked RA x two laps =  approx 573ft @ slow pace - stopped due to fatigue / min sob  with sats of 95 % at the end of the study.  No evidence of a pulmonary problem limiting activity other than obesity/ conditioning issues at this point.

## 2019-04-29 ENCOUNTER — Other Ambulatory Visit: Payer: Self-pay | Admitting: Internal Medicine

## 2019-05-04 ENCOUNTER — Other Ambulatory Visit: Payer: Self-pay

## 2019-05-04 ENCOUNTER — Ambulatory Visit: Payer: Medicare HMO | Admitting: Internal Medicine

## 2019-05-04 ENCOUNTER — Encounter: Payer: Self-pay | Admitting: Internal Medicine

## 2019-05-04 DIAGNOSIS — D869 Sarcoidosis, unspecified: Secondary | ICD-10-CM | POA: Diagnosis not present

## 2019-05-04 DIAGNOSIS — R0600 Dyspnea, unspecified: Secondary | ICD-10-CM

## 2019-05-04 DIAGNOSIS — R0609 Other forms of dyspnea: Secondary | ICD-10-CM

## 2019-05-04 NOTE — Progress Notes (Signed)
Corey Griffith, male    DOB: [birth year],    MRN: [id number]   Brief patient profile:  55 yowm never smoker healthy until  mid 30's tendency to bronchitis more in winter but around 2015 aspirated related to GI procedures in [place] and admitted and treated pna > went home on 02 to use whenever supine and then  Back problems/  surgery > gradually downhill since so self referred to pulmonary clinic 02/03/2019 with dx of sarcoid made by EBUS/ [name]  12/20/2017     History of Present Illness  02/03/2019  Pulmonary/ 1st office eval/Corey Griffith  Chief Complaint  Patient presents with  . Consult    Patient is here for possible sacoidosis. Patient has shortness of breath and dry cough. 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 Cough: dry cough worse when go cold to warm /sneezing fits assoc with nasal  - sporadic otherwise  Sleep: on side / one pillow no cough  SABA use:  Has one, not using  Arthritis ankles and knees  Sweats several night to point of changing clothes No ex cp / heart cath later this month On ACTAR since ? Summer 2020 and "it shrunk the lymph nodes" but still having soaking sweats rec Continue to walk as much as you can  Take prilosec (omeprazole)  Take 30- 60 min before your first and last meals of the day  GERD  diet Please schedule a follow up office visit in 4 weeks    03/23/2019  f/u ov/Corey Griffith re:  ? Sarcoid still on Actar  Chief Complaint  Patient presents with  . Follow-up    SOB and cough are unchanged.   Dyspnea:  MMRC3 = can't walk 100 yards even at a slow pace at a flat grade s stopping due to sob and arthritis both limiting  Cough:  Daytime sporadic, non productive  Sleeping: sleeping on side with 1-2 pillows, bed is flat  SABA use: none 02:  2lpm hs   rec Take half the dose of actar twice weekly then one quarter twice daily x 2 weeks then stop If getting worse >>> Short of breath, cough bad fatigue, nausea, worse than usual arthritis   start back on prednisone 10 mg daily  Until better then one half daily x one week then stop - if worse start back over.  Clinoril (sulindac) is safer than meloxicam for your kidneys    05/04/2019  f/u ov/Corey Griffith re:  Sarcoid/ off actar / on prednisone 5 mg daily (misunderstood instructions/ better on clinoril but takes on empty stomach bid not prn  Chief Complaint  Patient presents with  . Follow-up    Breathing is unchanged since the last visit. He is on pred 5 mg daily. He has not had to use his albuterol inhaler.   Dyspnea: walking to feed cows and back/ limited knees and back > sob  Cough: more sneezing than cough  Sleeping: on side/ bed is flat/ 2 pillows  SABA use: none 02: 2lpm hs / feels pretty good on rising.    No obvious day to day or daytime variability or assoc excess/ purulent sputum or mucus plugs or hemoptysis or cp or chest tightness, subjective wheeze or overt sinus or hb symptoms.  Sleeping as above without nocturnal  or early am exacerbation  of respiratory  c/o's or need for noct saba. Also denies any obvious fluctuation of symptoms with weather or environmental changes or other aggravating or alleviating factors except as outlined above   No unusual exposure hx or h/o childhood pna/ asthma or knowledge of premature birth.  Current Allergies, Complete Past Medical History, Past Surgical History, Family History, and Social History were reviewed in [employer] record.  ROS  The following are not active complaints unless bolded Hoarseness, sore throat, dysphagia, dental problems, itching, sneezing,  nasal congestion or discharge of excess mucus or purulent secretions, ear ache,   fever, chills, sweats, unintended wt loss or wt gain, classically pleuritic or  exertional cp,  orthopnea pnd or arm/hand swelling  or leg swelling, presyncope, palpitations, abdominal pain, anorexia, nausea, vomiting, diarrhea  or change in bowel habits or change in bladder habits, change in stools or change in urine, dysuria, hematuria,  rash, arthralgias, visual complaints, headache, numbness, weakness or ataxia or problems with walking or coordination,  change in mood or  memory.        Current Meds  Medication Sig  . albuterol (VENTOLIN HFA) 108 (90 Base) MCG/ACT inhaler Inhale 2 puffs into the lungs every 4 (four) hours as needed.  Marland Kitchen amLODipine (NORVASC) 5 MG tablet Take 5 mg by mouth daily.  Marland Kitchen atenolol (TENORMIN) 50 MG tablet Take 1 tablet by mouth daily.  . cyclobenzaprine (FLEXERIL) 10 MG tablet Take 10 mg by mouth 3 (three) times daily as needed.  Marland Kitchen FLUoxetine (PROZAC) 20 MG capsule Take 1 capsule by mouth daily.  Marland Kitchen JARDIANCE 25 MG TABS tablet Take 25 mg by mouth daily.  Marland Kitchen LIPITOR 80 MG tablet Take 80 mg by mouth daily.  Marland Kitchen omeprazole (PRILOSEC) 40 MG capsule TAKE 1 CAPSULE BY MOUTH 30-60 MINUTES BEFORE YOUR FIRST AND LAST MEALS OF THE DAY  . oxyCODONE-acetaminophen (PERCOCET) 10-325 MG tablet Take 1 tablet by mouth every 8 (eight) hours as needed for pain.  . OXYGEN Inhale 2 L into the lungs. 2L at night or when patient is laying down  . phenytoin (DILANTIN) 100 MG ER capsule Take 200-300 mg by mouth See admin instructions. Take 300 mg by mouth in the morning and take 200 mg by mouth at bedtime  . prasugrel (EFFIENT) 10 MG TABS tablet Take 10 mg by mouth daily.  . predniSONE (DELTASONE) 10 MG tablet Take 1 daily until better then one half daily x 1 week then stop  . Prucalopride Succinate (MOTEGRITY) 2 MG TABS Take 1 tablet by mouth daily.  . QUEtiapine (SEROQUEL) 100 MG tablet Take 100 mg by mouth at bedtime.   Marland Kitchen rOPINIRole (REQUIP) 0.5 MG tablet Take 0.5 mg by mouth daily.  . SULINDAC PO Take 1 tablet by mouth in the morning and at bedtime.                    Past Medical History:  Diagnosis Date  . Anxiety   . Arthritis    back, ankles  . Chronic kidney disease    high ,low, no meds  . Complication of anesthesia    low oxygen saturation and PMH of aspiration during upper GI procedure   . Depression   . GERD (gastroesophageal reflux disease)   . Headache   . Hypertension   . Mediastinal lymphadenopathy   . Pneumonia   . Pre-diabetes   . Requires supplemental oxygen  when lying down  . Seizures ([facility])        Objective:    05/04/2019            314   03/23/19 (!) 311 lb (141.1 kg)  02/03/19 (!) 313 lb 3.2 oz (142.1 kg)  12/26/17 (!) 302 lb (137 kg)    amb obese wm nad   Vital signs reviewed  05/04/2019  - Note at rest 02 sats  96% on RA   96  HEENT : pt wearing mask not removed for exam due to covid -19 concerns.    NECK :  without JVD/Nodes/TM/ nl carotid upstrokes bilaterally   LUNGS: no acc muscle use,  Nl contour chest which is clear to A and P bilaterally without cough on insp or exp maneuvers   CV:  RRR  no s3 or murmur or increase in P2, and no edema   ABD:  soft and nontender with nl inspiratory excursion in the supine position. No bruits or organomegaly appreciated, bowel sounds nl  MS:  Nl gait/ ext warm without deformities, calf tenderness, cyanosis or clubbing No obvious joint restrictions   SKIN: warm and dry without lesions    NEURO:  alert, approp, nl sensorium with  no motor or cerebellar deficits apparent.                     Assessment      .

## 2019-05-04 NOTE — Patient Instructions (Addendum)
Clinoril  is up to twice with meals as needed for arthritis   Prednisone 10 mg one half daily every other day x until they run out and call if you note Short of breath, cough bad fatigue, nausea, worse than usual arthritis    Please schedule a follow up office visit in 4 weeks, sooner if needed with cxr on return

## 2019-05-05 ENCOUNTER — Encounter: Payer: Self-pay | Admitting: Internal Medicine

## 2019-05-05 NOTE — Assessment & Plan Note (Signed)
NCG on EBUS  12/20/2017  - prednisone rx initially > wt gain  - Acthar rx summer 2020 >>> 02/03/2019  rec continue x 4 weeks then ? Taper off  - Angiotensin 02/03/2019  49 - ESR 02/03/2019   33  - 03/23/2019 rec wean off actar and regroup in 6 weeks and restart pred if flares - 05/04/2019 started himself back on pred when d/c actar (not the instructions given as no flare of any symptoms off actar so rec wean off prednisone )  Advised on pred wean off then f/u with walking study/ full restaging for sarcoid minus the bx.         Each maintenance medication was reviewed in detail including emphasizing most importantly the difference between maintenance and prns and under what circumstances the prns are to be triggered using an action plan format where appropriate.  Total time for H and P, chart review, counseling, teaching device and generating customized AVS unique to this office visit / charting = 20 min

## 2019-05-05 NOTE — Assessment & Plan Note (Signed)
Onset ? 2015 p aspirated from gi procedure - PFTs 01/23/14 no obst, minimal restriction. - 02/03/2019   Walked RA x two laps =  approx 541ft @ slow pace - stopped due to end of study  with sats of 97% at the end of the study s sob but said he couldn't walk any faster due to back. - 02/03/2019  IgE  = 86 - 03/23/2019   Walked RA x two laps =  approx 569ft @ slow pace - stopped due to fatigue / min sob  with sats of 95 % at the end of the study  Seems to be improving with rx of arthritis but steadily gaining wt Corey Griffith

## 2019-06-05 ENCOUNTER — Ambulatory Visit: Payer: Medicare HMO | Admitting: Internal Medicine

## 2019-06-30 ENCOUNTER — Other Ambulatory Visit: Payer: Self-pay | Admitting: Internal Medicine

## 2019-06-30 DIAGNOSIS — D869 Sarcoidosis, unspecified: Secondary | ICD-10-CM

## 2019-07-01 ENCOUNTER — Ambulatory Visit: Payer: Medicare HMO | Admitting: Internal Medicine

## 2019-07-03 ENCOUNTER — Ambulatory Visit: Payer: Medicare HMO | Admitting: Internal Medicine

## 2019-08-10 ENCOUNTER — Ambulatory Visit: Payer: Medicare Other | Admitting: Internal Medicine

## 2019-08-10 ENCOUNTER — Encounter: Payer: Self-pay | Admitting: Internal Medicine

## 2019-08-10 ENCOUNTER — Ambulatory Visit (INDEPENDENT_AMBULATORY_CARE_PROVIDER_SITE_OTHER): Payer: Medicare Other

## 2019-08-10 ENCOUNTER — Other Ambulatory Visit: Payer: Self-pay

## 2019-08-10 DIAGNOSIS — R0609 Other forms of dyspnea: Secondary | ICD-10-CM

## 2019-08-10 DIAGNOSIS — D869 Sarcoidosis, unspecified: Secondary | ICD-10-CM

## 2019-08-10 DIAGNOSIS — R0600 Dyspnea, unspecified: Secondary | ICD-10-CM

## 2019-08-10 NOTE — Progress Notes (Signed)
Corey Griffith, male    DOB: [birth year],    MRN: [id number]   Brief patient profile:  67 yowm never smoker healthy until  mid 30's tendency to bronchitis more in winter but around 2015 aspirated related to GI procedures in [place] and admitted and treated pna > went home on 02 to use whenever supine and then  Back problems/  surgery > gradually downhill since so self referred to pulmonary clinic 02/03/2019 with dx of sarcoid made by EBUS/ [name]  12/20/2017     History of Present Illness  02/03/2019  Pulmonary/ 1st office eval/Damara Klunder  Chief Complaint  Patient presents with  . Consult    Patient is here for possible sacoidosis. Patient has shortness of breath and dry cough. 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 Cough: dry cough worse when go cold to warm /sneezing fits assoc with nasal  - sporadic otherwise  Sleep: on side / one pillow no cough  SABA use:  Has one, not using  Arthritis ankles and knees  Sweats several night to point of changing clothes No ex cp / heart cath later this month On ACTAR since ? Summer 2020 and "it shrunk the lymph nodes" but still having soaking sweats rec Continue to walk as much as you can  Take prilosec (omeprazole)  Take 30- 60 min before your first and last meals of the day  GERD  diet Please schedule a follow up office visit in 4 weeks    03/23/2019  f/u ov/Corey Griffith re:  ? Sarcoid still on Actar  Chief Complaint  Patient presents with  . Follow-up    SOB and cough are unchanged.   Dyspnea:  MMRC3 = can't walk 100 yards even at a slow pace at a flat grade s stopping due to sob and arthritis both limiting  Cough:  Daytime sporadic, non productive  Sleeping: sleeping on side with 1-2 pillows, bed is flat  SABA use: none 02:  2lpm hs   rec Take half the dose of actar twice weekly then one quarter twice daily x 2 weeks then stop If getting worse >>> Short of breath, cough bad fatigue, nausea, worse than usual arthritis   start back on prednisone 10 mg daily  Until better then one half daily x one week then stop - if worse start back over.  Clinoril (sulindac) is safer than meloxicam for your kidneys    05/04/2019  f/u ov/Corey Griffith re:  Sarcoid/ off actar / on prednisone 5 mg daily (misunderstood instructions/ better on clinoril but takes on empty stomach bid not prn  Chief Complaint  Patient presents with  . Follow-up    Breathing is unchanged since the last visit. He is on pred 5 mg daily. He has not had to use his albuterol inhaler.   Dyspnea: walking to feed cows and back/ limited knees and back > sob  Cough: more sneezing than cough  Sleeping: on side/ bed is flat/ 2 pillows  SABA use: none 02: 2lpm hs / feels pretty good on rising. rec Clinoril  is up to twice with meals as needed for arthritis  Prednisone 10 mg one half daily every other day x until they run out and call if you note Short of  breath, cough bad fatigue, nausea, worse than usual arthritis     08/10/2019  f/u ov/Corey Griffith re: sarcoidosis off prednisone off x a month s any change Says was in [place] [facility] with pna / declines covid vaccination"based on what he's heard"  Chief Complaint  Patient presents with  . Follow-up    SOB and extreme fatigue on exertion.   Dyspnea:  Walking to feed cows no change mild doe/ not usually checking sats  Cough: bout the same, not really a problem  Sleeping: on side/ bed is flat 2 pillows  SABA use: none  02: 2lhs / feels rested  Back > knees > elbows eval by rheumatologist who did not know he was on clinoril "because my PCP gave it to me"    No obvious day to day or daytime variability or assoc excess/ purulent sputum or mucus plugs or hemoptysis or cp or chest tightness, subjective wheeze or overt sinus or hb  symptoms.   Sleeping as above  without nocturnal  or early am exacerbation  of respiratory  c/o's or need for noct saba. Also denies any obvious fluctuation of symptoms with weather or environmental changes or other aggravating or alleviating factors except as outlined above   No unusual exposure hx or h/o childhood pna/ asthma or knowledge of premature birth.  Current Allergies, Complete Past Medical History, Past Surgical History, Family History, and Social History were reviewed in [employer] record.  ROS  The following are not active complaints unless bolded Hoarseness, sore throat, dysphagia, dental problems, itching, sneezing,  nasal congestion or discharge of excess mucus or purulent secretions, ear ache,   fever, chills, sweats, unintended wt loss or wt gain, classically pleuritic or exertional cp,  orthopnea pnd or arm/hand swelling  or leg swelling, presyncope, palpitations, abdominal pain, anorexia, nausea, vomiting, diarrhea  or change in bowel habits or change in bladder habits, change in stools or change in urine, dysuria, hematuria,  rash, arthralgias, visual complaints, headache, numbness, weakness or ataxia or problems with walking or coordination,  change in mood or  memory.        Current Meds  Medication Sig  . albuterol (VENTOLIN HFA) 108 (90 Base) MCG/ACT inhaler Inhale 2 puffs into the lungs every 4 (four) hours as needed.  Marland Kitchen amLODipine (NORVASC) 5 MG tablet Take 5 mg by mouth daily.  Marland Kitchen atenolol (TENORMIN) 50 MG tablet Take 1 tablet by mouth daily.  . cyclobenzaprine (FLEXERIL) 10 MG tablet Take 10 mg by mouth 3 (three) times daily as needed.  Marland Kitchen FLUoxetine (PROZAC) 20 MG capsule Take 1 capsule by mouth daily.  Marland Kitchen JARDIANCE 25 MG TABS tablet Take 25 mg by mouth daily.  Marland Kitchen LIPITOR 80 MG tablet Take 80 mg by mouth daily.  Marland Kitchen omeprazole (PRILOSEC) 40 MG capsule TAKE 1 CAPSULE BY MOUTH 30-60 MINUTES BEFORE YOUR FIRST AND LAST MEALS OF THE DAY  .  oxyCODONE-acetaminophen (PERCOCET) 10-325 MG tablet Take 1 tablet by mouth every 8 (eight) hours as needed for pain.  . OXYGEN Inhale 2 L into the lungs. 2L at night or when patient is laying down  . phenytoin (DILANTIN) 100 MG ER capsule Take 200-300 mg by mouth See admin instructions. Take 300 mg by mouth in the morning and take 200 mg by mouth at bedtime  . prasugrel (EFFIENT) 10 MG TABS tablet Take 10 mg by mouth daily.  .     . Prucalopride Succinate (MOTEGRITY) 2 MG TABS Take 1 tablet by mouth  daily.  . QUEtiapine (SEROQUEL) 100 MG tablet Take 100 mg by mouth at bedtime.   Marland Kitchen rOPINIRole (REQUIP) 0.5 MG tablet Take 0.5 mg by mouth daily.  . SULINDAC PO Take 1 tablet by mouth in the morning and at bedtime.                  Past Medical History:  Diagnosis Date  . Anxiety   . Arthritis    back, ankles  . Chronic kidney disease    high ,low, no meds  . Complication of anesthesia    low oxygen saturation and PMH of aspiration during upper GI procedure   . Depression   . GERD (gastroesophageal reflux disease)   . Headache   . Hypertension   . Mediastinal lymphadenopathy   . Pneumonia   . Pre-diabetes   . Requires supplemental oxygen    when lying down  . Seizures (HCC)        Objective:     08/10/2019           310  05/04/2019            314   03/23/19 (!) 311 lb (141.1 kg)  02/03/19 (!) 313 lb 3.2 oz (142.1 kg)  12/26/17 (!) 302 lb (137 kg)     somber obese wm nad  Vital signs reviewed  08/10/2019  - Note at rest 02 sats  97% on RA     HEENT : pt wearing mask not removed for exam due to covid -19 concerns.    NECK :  without JVD/Nodes/TM/ nl carotid upstrokes bilaterally   LUNGS: no acc muscle use,  Nl contour chest which is clear to A and P bilaterally without cough on insp or exp maneuvers   CV:  RRR  no s3 or murmur or increase in P2, and no edema   ABD:  soft and nontender with nl inspiratory excursion in the supine position. No bruits or  organomegaly appreciated, bowel sounds nl  MS:  Nl gait/ ext warm without deformities, calf tenderness, cyanosis or clubbing No obvious joint restrictions   SKIN: warm and dry without lesions    NEURO:  alert, approp, nl sensorium with  no motor or cerebellar deficits apparent.      CXR PA and Lateral:   08/10/2019 :    I personally reviewed images and  impression as follows:   No evidence of active sarcoidosis             Assessment      .

## 2019-08-10 NOTE — Patient Instructions (Addendum)
Please tell you your rheumatologist you were on clinoril when you saw him but I recommend you stop it to see how you do without it and it really is the same don't take it.   I strongly recommend you reconsider the covid 19 vaccine especially if you here about variants in your area.   If you are satisfied with your treatment plan,  let your doctor know and he/she can either refill your medications or you can return here when your prescription runs out.     If in any way you are not 100% satisfied,  please tell us.  If 100% better, tell your friends!  Pulmonary follow up is as needed

## 2019-08-11 ENCOUNTER — Encounter: Payer: Self-pay | Admitting: Internal Medicine

## 2019-08-11 NOTE — Assessment & Plan Note (Signed)
Onset ? 2015 p aspirated from gi procedure - PFTs 01/23/14 no obst, minimal restriction. - 02/03/2019   Walked RA x two laps =  approx 582ft @ slow pace - stopped due to end of study  with sats of 97% at the end of the study s sob but said he couldn't walk any faster due to back. - 02/03/2019  IgE  = 86 - 03/23/2019   Walked RA x two laps =  approx 546ft @ slow pace - stopped due to fatigue / min sob  with sats of 95 % at the end of the study. - prednisone d/c around Jun 20 2019 -  08/10/2019   Walked RA  2 laps @ approx 231ft each @ avg pace  pace  stopped due to end of study,  no sob, sats 96%    Improved since last ov s prednisone now so unlikely to need it in the future   Advised: Make sure you check your oxygen saturations at highest level of activity to be sure it stays over 90% and adjust upward to maintain this level if needed but remember to turn it back to previous settings when you stop (to conserve your supply).

## 2019-08-11 NOTE — Assessment & Plan Note (Signed)
NCG on EBUS  12/20/2017  - prednisone rx initially > wt gain  - Acthar rx summer 2020 >>> 02/03/2019  rec continue x 4 weeks then ? Taper off  - Angiotensin 02/03/2019  51 - ESR 02/03/2019   35  - 03/23/2019 rec wean off actar and regroup in 6 weeks and restart pred if flares - 05/04/2019 started himself back on pred when d/c actar (not the instructions given as no flare of any symptoms off actar so rec wean off prednisone )> off all steroids by 06/20/19 with no evidence active pulmonary dx 08/10/2019   A good rule of thumb is that >95% of pts with active sarcoid in any organ will have some plain cxr changes - on the other hand  if there are active pulmonary symptoms the cxr will look much worse than the patient:  No evidence of either scenario here/ strongly doubt active dz    He does have residual arthritis and needs to keep f/u with rheum but be more forthcoming to assume accurate/ full med reconciliation and disclose to  rheumatologist all meds, esp arthritis meds.  Pt informed of the seriousness of COVID 19 infection as a direct risk to lung health  and safey and to close contacts and should continue to wear a facemask in public and minimize exposure to public locations but especially avoid any area or activity where non-close contacts are not observing distancing or wearing an appropriate face mask.  I strongly recommended vaccine after discussion of the risk/ benefit of the moderna and pfizer vaccines which have proven safe and effective with over 100 million treatments.   plmonary f/u is prn           Each maintenance medication was reviewed in detail including emphasizing most importantly the difference between maintenance and prns and under what circumstances the prns are to be triggered using an action plan format where appropriate.  Total time for H and P, chart review, counseling,  directly observing portions of ambulatory 02 saturation study/  and generating customized AVS unique to  this office visit / charting = 30 min

## 2019-10-21 IMAGING — CR DG CHEST 1V PORT
1 series · 1 of 1 positions shown · non-contrast
Comparison: Chest radiographs 3800 hours today and earlier.

CLINICAL DATA: 62-year-old male status post bronchoscopy.
Mediastinal and hilar lymphadenopathy.

EXAM:
PORTABLE CHEST 1 VIEW

[AP]
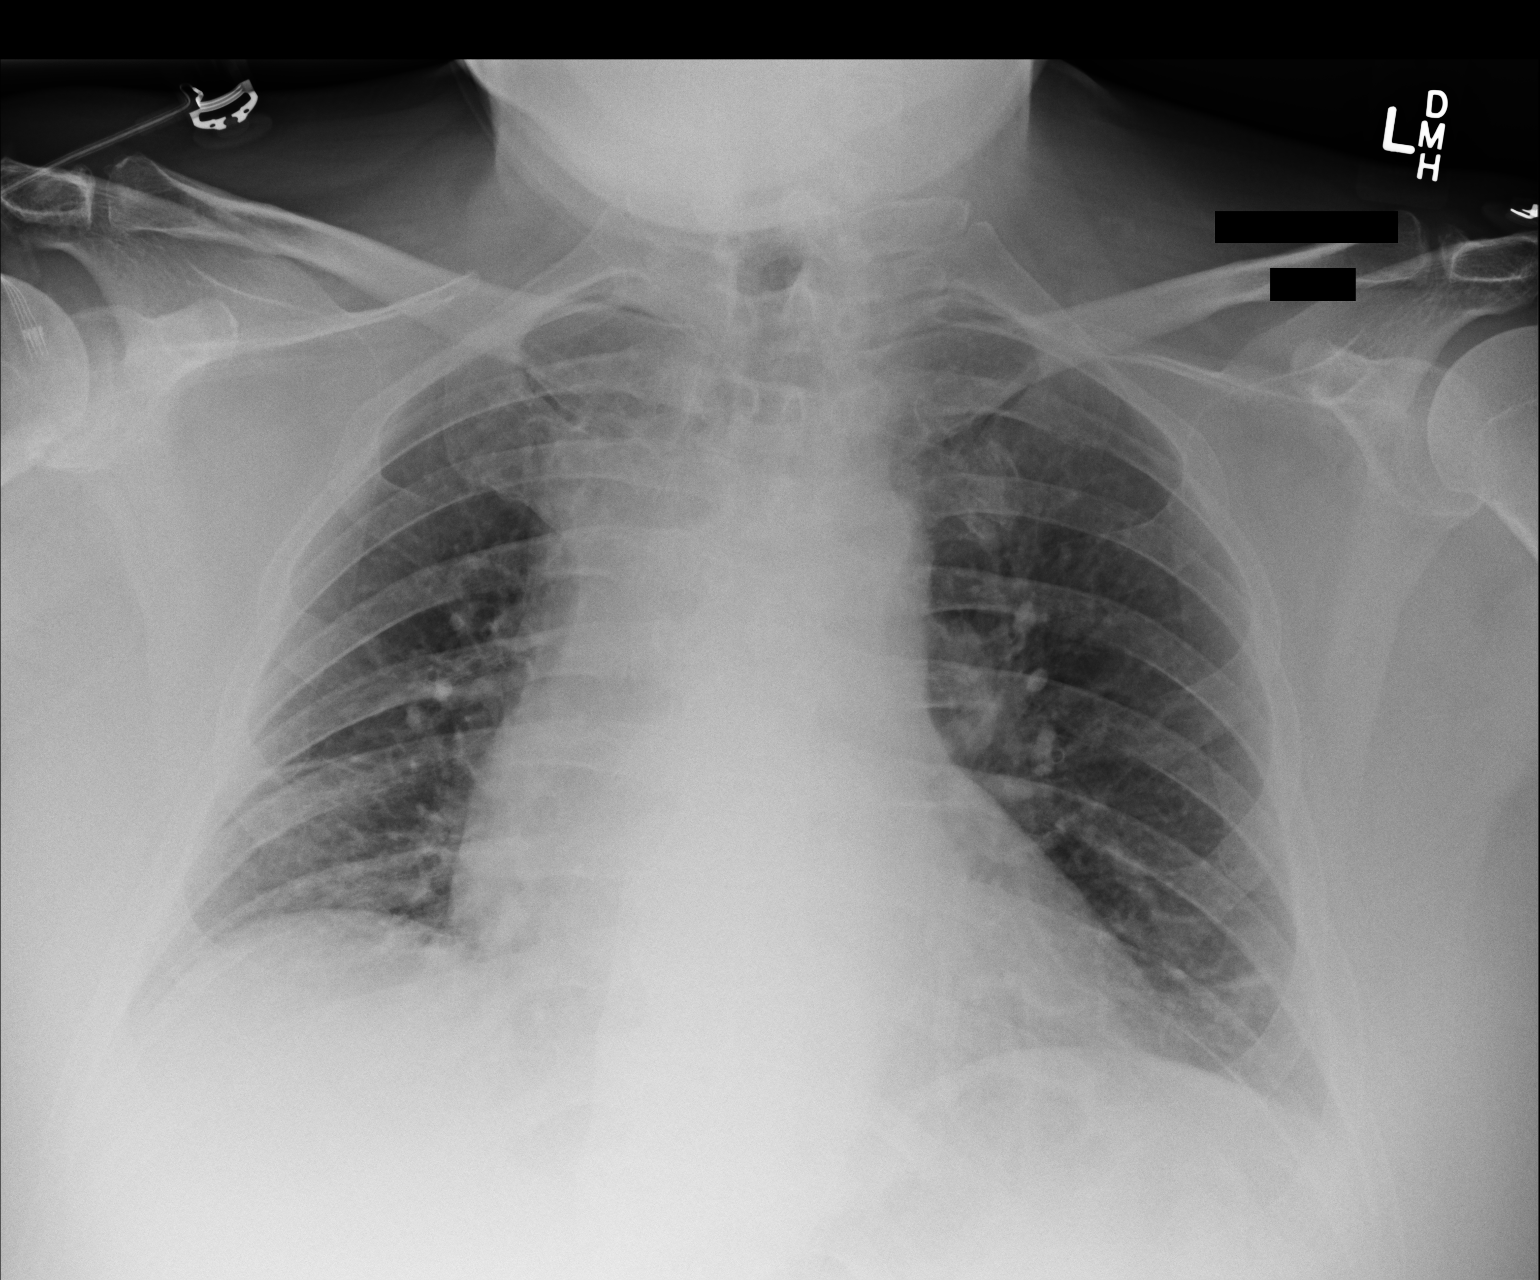

[1 of 1 positions shown; findings below may reference images not displayed]

FINDINGS: Portable AP semi upright view at 0778 hours. Mildly lower lung
volumes. Allowing for portable technique the lungs are clear. Stable
cardiac size and mediastinal contours. No pneumothorax.
IMPRESSION: No acute or adverse findings identified status post bronchoscopy.

## 2020-04-19 DEATH — deceased

## 2020-12-04 IMAGING — DX DG CHEST 2V
2 series · 2 of 2 positions shown · non-contrast
Comparison: None.

CLINICAL DATA: Sarcoid

EXAM:
CHEST - 2 VIEW

[chest pa]
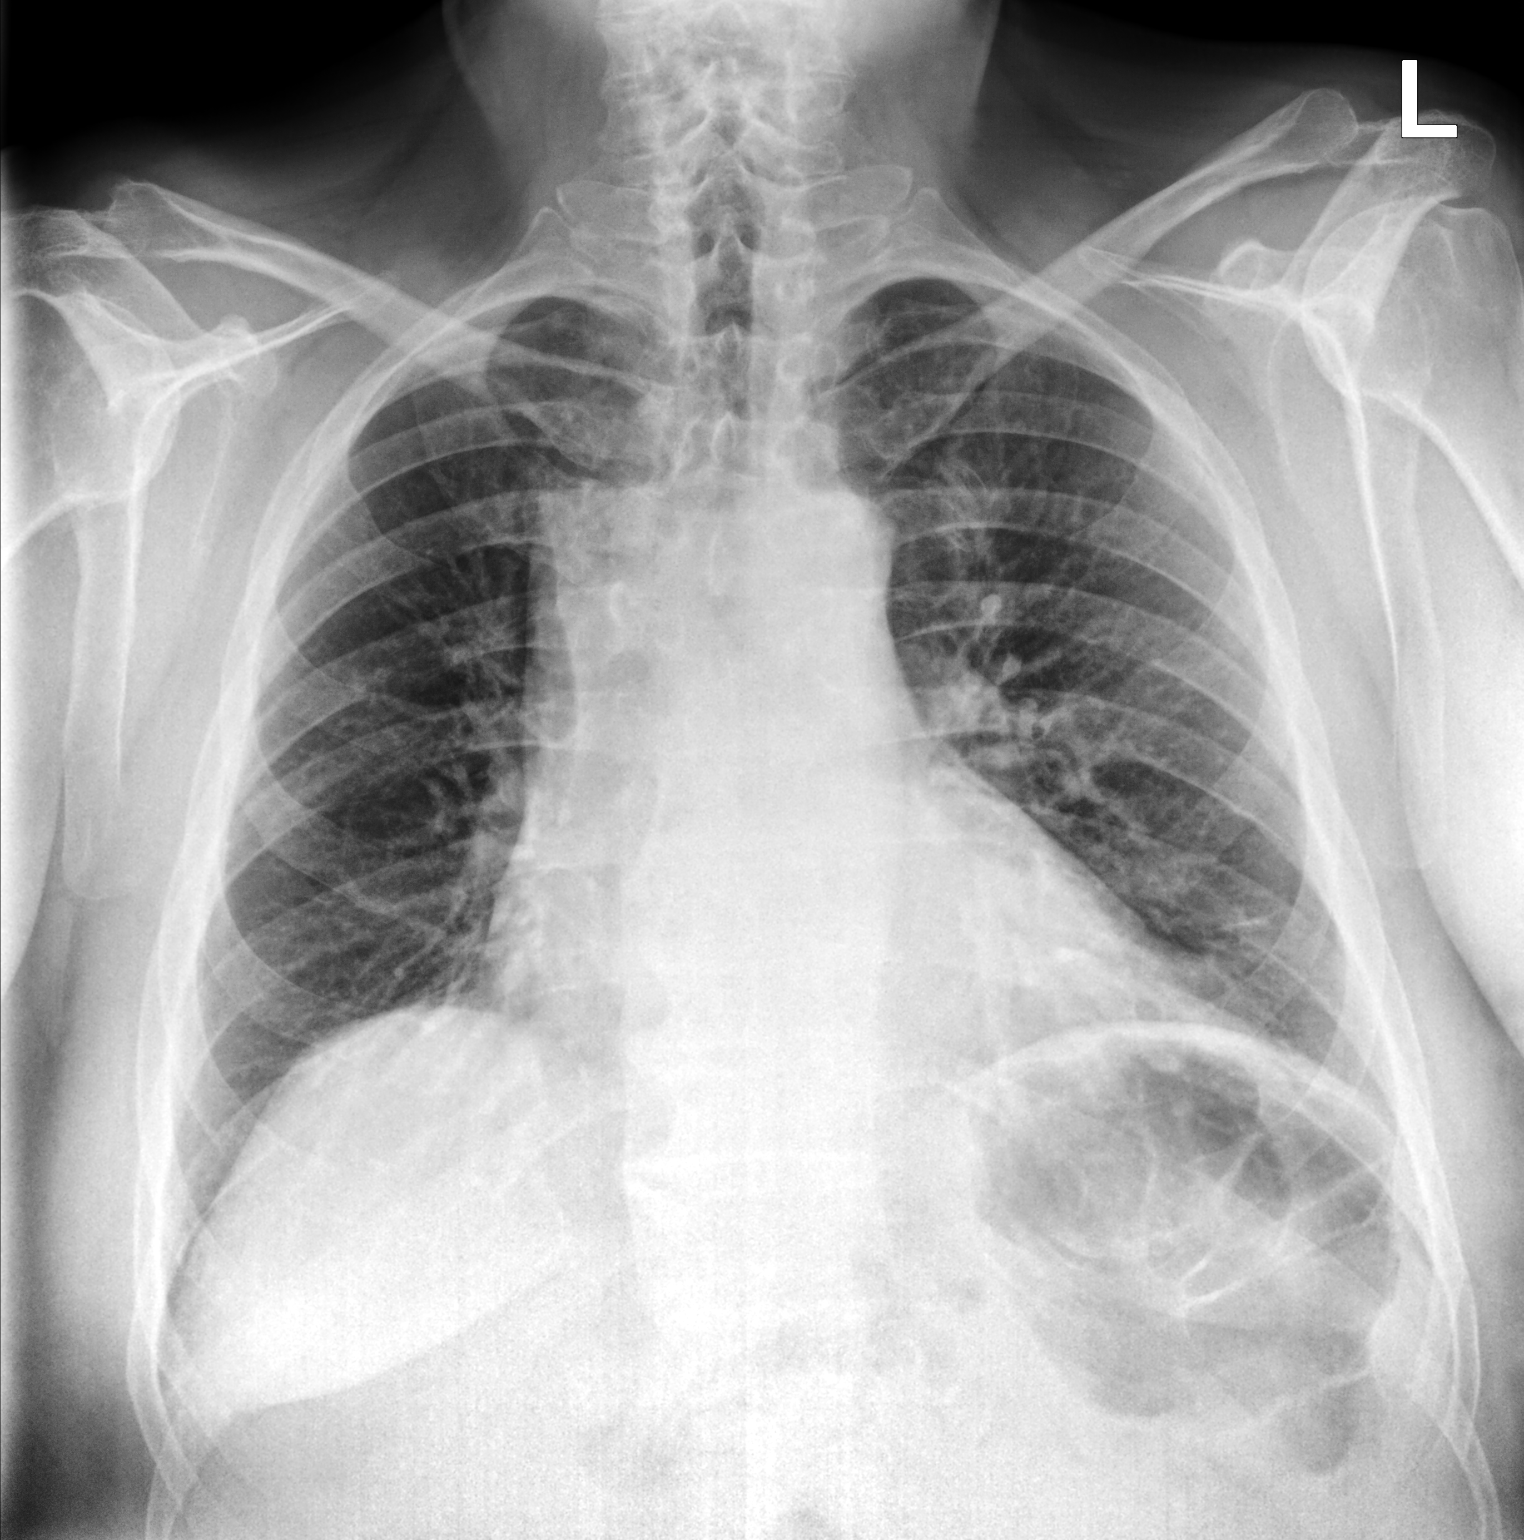

[chest lat]
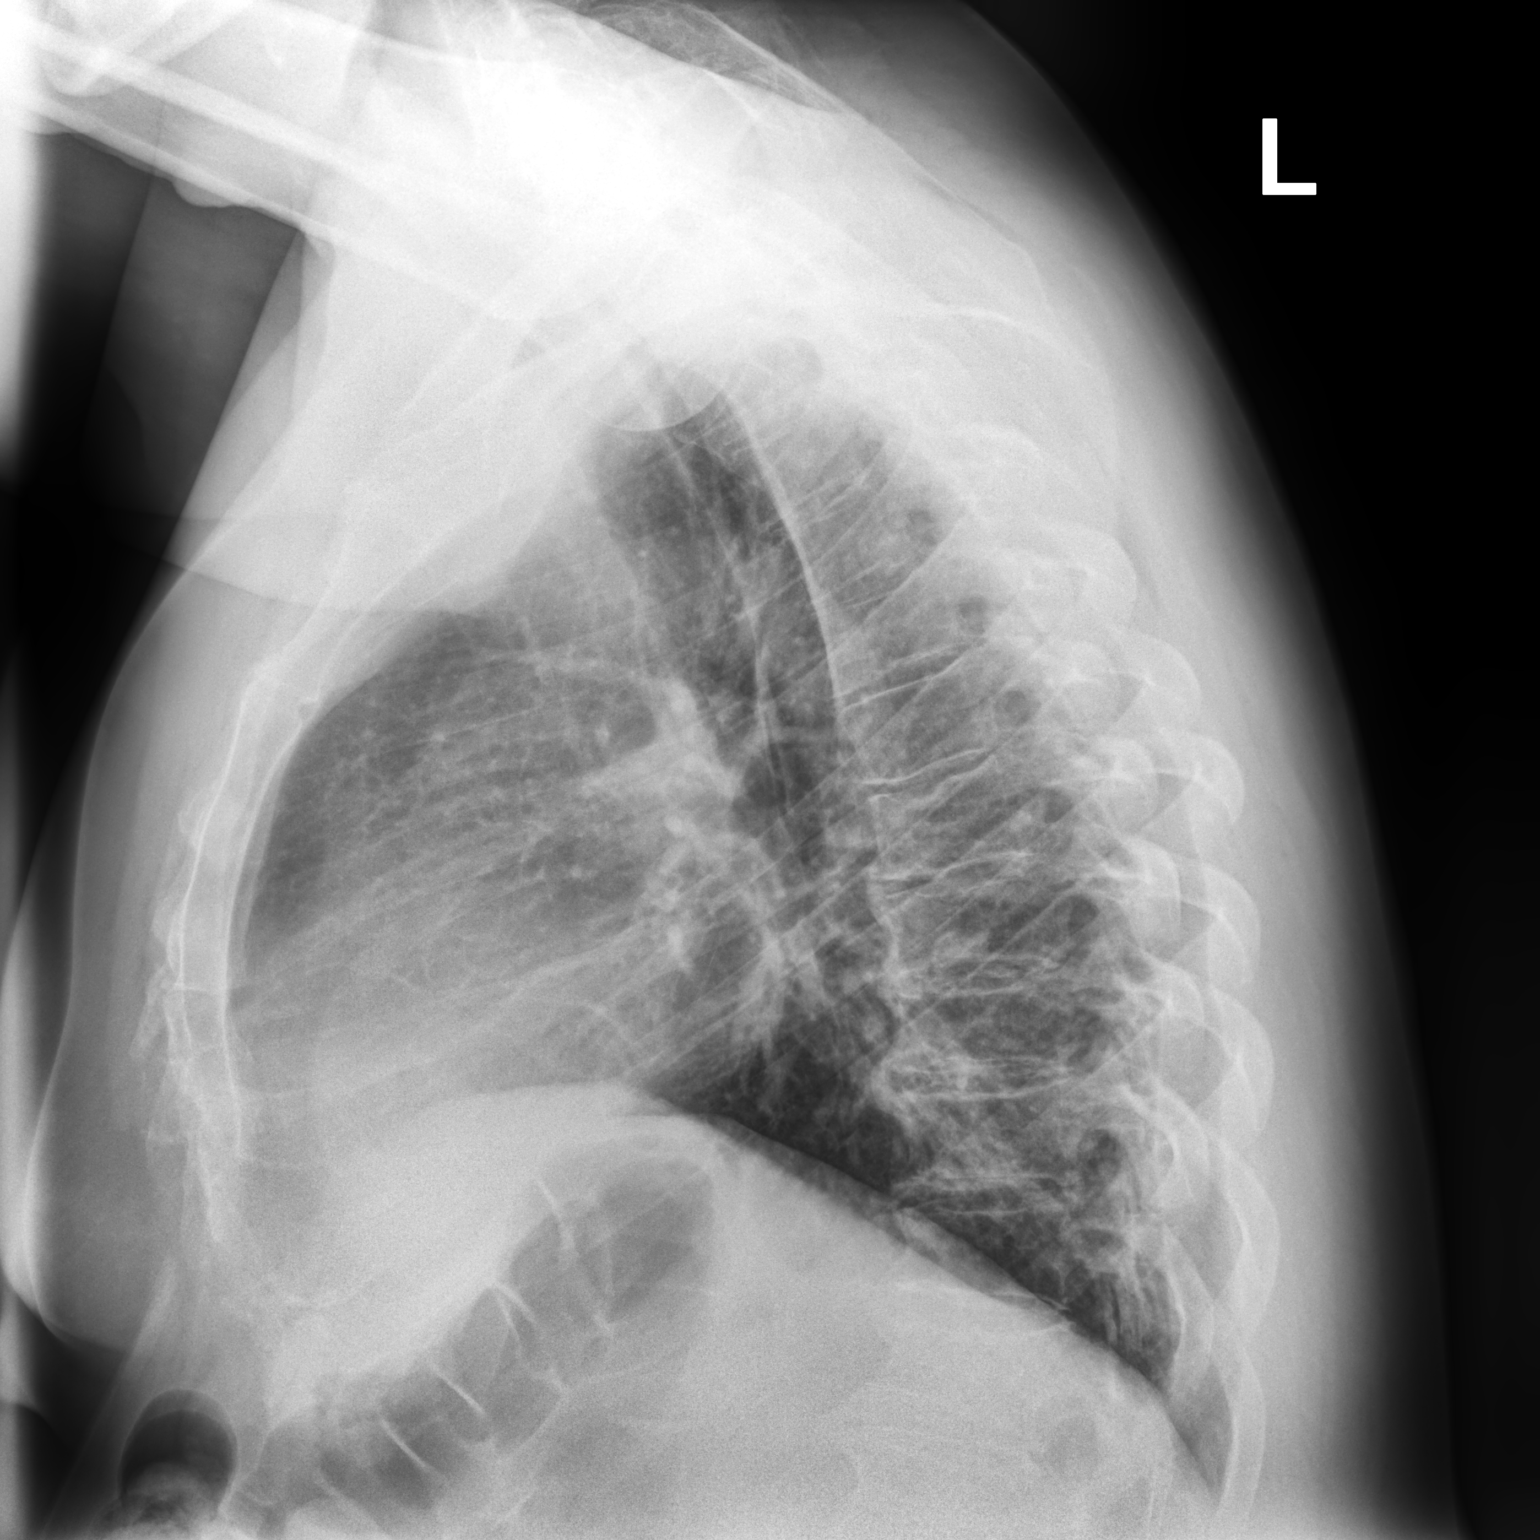

[2 of 2 positions shown; findings below may reference images not displayed]

FINDINGS: Mild cardiomegaly. Probable mild scarring or atelectasis at the left
lung base, not significantly changed compared to prior examination.
Disc degenerative disease of the thoracic spine.
IMPRESSION: 1.  Cardiomegaly.

2. Probable mild scarring or atelectasis at the left lung base, not
significantly changed compared to prior examination. There are no
specific radiographic findings to suggest sarcoidosis. CT may be
used to further evaluate if desired.
